# Patient Record
Sex: Female | Born: 1980 | Race: White | Hispanic: No | Marital: Single | Smoking: Current every day smoker
Health system: Southern US, Community
[De-identification: ages and names within clinical notes are randomized; demographics above are authoritative.]

---

## 2005-07-04 ENCOUNTER — Emergency Department (HOSPITAL_COMMUNITY): Admission: EM | Admit: 2005-07-04 | Discharge: 2005-07-05 | Payer: Self-pay | Admitting: Emergency Medicine

## 2012-04-24 ENCOUNTER — Encounter (HOSPITAL_COMMUNITY): Payer: Self-pay | Admitting: Cardiology

## 2012-04-24 ENCOUNTER — Emergency Department (HOSPITAL_COMMUNITY)
Admission: EM | Admit: 2012-04-24 | Discharge: 2012-04-24 | Discharge status: Against Medical Advice | Payer: Self-pay | Attending: Emergency Medicine | Admitting: Emergency Medicine

## 2012-04-24 DIAGNOSIS — Z5321 Procedure and treatment not carried out due to patient leaving prior to being seen by health care provider: Secondary | ICD-10-CM

## 2012-04-24 DIAGNOSIS — R05 Cough: Secondary | ICD-10-CM | POA: Insufficient documentation

## 2012-04-24 DIAGNOSIS — R059 Cough, unspecified: Secondary | ICD-10-CM | POA: Insufficient documentation

## 2012-04-24 DIAGNOSIS — F17200 Nicotine dependence, unspecified, uncomplicated: Secondary | ICD-10-CM | POA: Insufficient documentation

## 2012-04-24 DIAGNOSIS — J029 Acute pharyngitis, unspecified: Secondary | ICD-10-CM | POA: Insufficient documentation

## 2012-04-24 NOTE — ED Notes (Signed)
Pt reports a cough for the past 2 months and sore throat for the past 2 days. Cough is non-productive.

## 2012-04-24 NOTE — ED Notes (Signed)
Pt name called x3 with no answer.

## 2012-04-24 NOTE — ED Notes (Signed)
Called x2 in waiting room; no answer.  Will try again. 

## 2012-04-28 NOTE — ED Provider Notes (Signed)
Pt left without being seen.    Fayrene Helper, PA-C 04/28/12 [phone]

## 2012-04-30 NOTE — ED Provider Notes (Signed)
Medical screening examination/treatment/procedure(s) were performed by non-physician practitioner and as supervising physician I was immediately available for consultation/collaboration.  Bayan Hedstrom T Taylan Marez, MD 04/30/12 2135 

## 2013-02-28 ENCOUNTER — Encounter (HOSPITAL_COMMUNITY): Payer: Self-pay | Admitting: Emergency Medicine

## 2013-02-28 ENCOUNTER — Emergency Department (HOSPITAL_COMMUNITY): Payer: Self-pay

## 2013-02-28 ENCOUNTER — Emergency Department (HOSPITAL_COMMUNITY)
Admission: EM | Admit: 2013-02-28 | Discharge: 2013-02-28 | Disposition: A | Discharge status: Home / Self Care | Payer: Self-pay | Attending: Emergency Medicine | Admitting: Emergency Medicine

## 2013-02-28 DIAGNOSIS — F17200 Nicotine dependence, unspecified, uncomplicated: Secondary | ICD-10-CM | POA: Insufficient documentation

## 2013-02-28 DIAGNOSIS — A499 Bacterial infection, unspecified: Secondary | ICD-10-CM | POA: Insufficient documentation

## 2013-02-28 DIAGNOSIS — R112 Nausea with vomiting, unspecified: Secondary | ICD-10-CM | POA: Insufficient documentation

## 2013-02-28 DIAGNOSIS — A599 Trichomoniasis, unspecified: Secondary | ICD-10-CM

## 2013-02-28 DIAGNOSIS — B9689 Other specified bacterial agents as the cause of diseases classified elsewhere: Secondary | ICD-10-CM

## 2013-02-28 DIAGNOSIS — R109 Unspecified abdominal pain: Secondary | ICD-10-CM

## 2013-02-28 DIAGNOSIS — N760 Acute vaginitis: Secondary | ICD-10-CM | POA: Insufficient documentation

## 2013-02-28 DIAGNOSIS — Z3202 Encounter for pregnancy test, result negative: Secondary | ICD-10-CM | POA: Insufficient documentation

## 2013-02-28 DIAGNOSIS — R197 Diarrhea, unspecified: Secondary | ICD-10-CM | POA: Insufficient documentation

## 2013-02-28 LAB — CBC WITH DIFFERENTIAL/PLATELET
Basophils Absolute: 0.1 10*3/uL (ref 0.0–0.1)
Eosinophils Relative: 1 % (ref 0–5)
Lymphocytes Relative: 31 % (ref 12–46)
MCV: 92.3 fL (ref 78.0–100.0)
Neutrophils Relative %: 61 % (ref 43–77)
Platelets: 321 10*3/uL (ref 150–400)
RDW: 13 % (ref 11.5–15.5)
WBC: 10.7 10*3/uL — ABNORMAL HIGH (ref 4.0–10.5)

## 2013-02-28 LAB — URINE MICROSCOPIC-ADD ON

## 2013-02-28 LAB — POCT I-STAT, CHEM 8
BUN: 14 mg/dL (ref 6–23)
Chloride: 108 mEq/L (ref 96–112)
Creatinine, Ser: 1.4 mg/dL — ABNORMAL HIGH (ref 0.50–1.10)
Glucose, Bld: 98 mg/dL (ref 70–99)
Potassium: 3.8 mEq/L (ref 3.5–5.1)

## 2013-02-28 LAB — COMPREHENSIVE METABOLIC PANEL
ALT: 19 U/L (ref 0–35)
AST: 23 U/L (ref 0–37)
CO2: 21 mEq/L (ref 19–32)
Calcium: 9.4 mg/dL (ref 8.4–10.5)
GFR calc non Af Amer: 63 mL/min — ABNORMAL LOW (ref >90)
Potassium: 4 mEq/L (ref 3.5–5.1)
Sodium: 140 mEq/L (ref 135–145)
Total Protein: 7.3 g/dL (ref 6.0–8.3)

## 2013-02-28 LAB — URINALYSIS, ROUTINE W REFLEX MICROSCOPIC
Bilirubin Urine: NEGATIVE
Hgb urine dipstick: NEGATIVE
Specific Gravity, Urine: 1.022 (ref 1.005–1.030)
pH: 6 (ref 5.0–8.0)

## 2013-02-28 LAB — WET PREP, GENITAL

## 2013-02-28 MED ORDER — HYDROCODONE-ACETAMINOPHEN 5-325 MG PO TABS
2.0000 | ORAL_TABLET | Freq: Once | ORAL | Status: AC
  Administered 2013-02-28: 2 via ORAL
  Filled 2013-02-28: qty 2

## 2013-02-28 MED ORDER — IOHEXOL 300 MG/ML  SOLN
25.0000 mL | INTRAMUSCULAR | Status: AC
  Administered 2013-02-28: 25 mL via ORAL

## 2013-02-28 MED ORDER — METRONIDAZOLE 500 MG PO TABS: 500.0000 mg | ORAL_TABLET | Freq: Two times a day (BID) | ORAL | Status: DC

## 2013-02-28 MED ORDER — ONDANSETRON 4 MG PO TBDP
8.0000 mg | ORAL_TABLET | Freq: Once | ORAL | Status: AC
  Administered 2013-02-28: 8 mg via ORAL
  Filled 2013-02-28: qty 2

## 2013-02-28 MED ORDER — CIPROFLOXACIN HCL 500 MG PO TABS: 500.0000 mg | ORAL_TABLET | Freq: Two times a day (BID) | ORAL | Status: DC

## 2013-02-28 MED ORDER — PROMETHAZINE HCL 25 MG PO TABS: 25.0000 mg | ORAL_TABLET | Freq: Four times a day (QID) | ORAL | Status: DC | PRN

## 2013-02-28 MED ORDER — ONDANSETRON HCL 4 MG/2ML IJ SOLN
4.0000 mg | INTRAMUSCULAR | Status: AC
  Administered 2013-02-28: 4 mg via INTRAVENOUS
  Filled 2013-02-28: qty 2

## 2013-02-28 MED ORDER — HYDROMORPHONE HCL PF 1 MG/ML IJ SOLN
1.0000 mg | Freq: Once | INTRAMUSCULAR | Status: AC
  Administered 2013-02-28: 1 mg via INTRAVENOUS
  Filled 2013-02-28: qty 1

## 2013-02-28 MED ORDER — SODIUM CHLORIDE 0.9 % IV BOLUS (SEPSIS)
1000.0000 mL | Freq: Once | INTRAVENOUS | Status: AC
  Administered 2013-02-28: 1000 mL via INTRAVENOUS

## 2013-02-28 MED ORDER — HYDROCODONE-ACETAMINOPHEN 5-325 MG PO TABS: 1.0000 | ORAL_TABLET | Freq: Four times a day (QID) | ORAL | Status: DC | PRN

## 2013-02-28 MED ORDER — KETOROLAC TROMETHAMINE 30 MG/ML IJ SOLN
30.0000 mg | Freq: Once | INTRAMUSCULAR | Status: AC
  Administered 2013-02-28: 30 mg via INTRAVENOUS
  Filled 2013-02-28: qty 1

## 2013-02-28 NOTE — ED Notes (Signed)
Patient tolerating contrast.

## 2013-02-28 NOTE — Progress Notes (Signed)
Met Patient at bedside.Role of Case Manager explained.Patient verbalizes her understanding.Patient reports Increased abdominal pain as reason for ED visit.Patient reports she resides in  [place] with her sister.She does not have health Insurance or a PCP. Education provided on Importance of establishing a PCP.Resource sheet for the [employer] Provided.This Clinical research associate explained with patient consent she can e-mail the [facility] and help set up patients appointment.Patient receptive to assistance and provided her consent. Patient provided with a resource card for the [employer] and a [place] -Prescription drug plan.No other Case Manger needs identified.

## 2013-02-28 NOTE — Progress Notes (Signed)
Patient provided with a [place] prescription discount card.

## 2013-02-28 NOTE — ED Provider Notes (Signed)
Medical screening examination/treatment/procedure(s) were performed by non-physician practitioner and as supervising physician I was immediately available for consultation/collaboration.     Geoffery Lyons, MD 02/28/13 [phone]

## 2013-02-28 NOTE — ED Notes (Signed)
Pt. reports right lateral abdominal pain with nausea, vomitting and diarrhea for 2 days, denies fever or chills. No urinary discomfort.

## 2013-02-28 NOTE — ED Provider Notes (Signed)
CSN: [id number]     Arrival date & time 02/28/13  0031 History   First MD Initiated Contact with Patient 02/28/13 0354     Chief Complaint  Patient presents with  . Abdominal Pain   (Consider location/radiation/quality/duration/timing/severity/associated sxs/prior Treatment) HPI Comments: Patient is a 32 year old female with no significant past medical history presents for abdominal pain x2 days. Patient states abdominal pain has been worsening since onset without any modifying factors. Patient describes the pain as sharp, wave-like, and radiating from her RLQ around to her R flank. She endorses associated nausea, NB/NB emesis, and nonbloody diarrhea the last of which was 1 hour ago. Patient denies associated fever, CP, SOB, melena, hematochezia, dysuria, hematuria, vaginal pain or discharge, and numbness/tingling. Abdominal surgical hx significant only for C-section. No hx of kidney stones.  Patient is a 32 y.o. female presenting with abdominal pain. The history is provided by the patient. No language interpreter was used.  Abdominal Pain   Past Medical History  Diagnosis Date  . Cesarean delivery delivered    History reviewed. No pertinent past surgical history. No family history on file. History  Substance Use Topics  . Smoking status: Current Every Day Smoker  . Smokeless tobacco: Not on file  . Alcohol Use: No   OB History   Grav Para Term Preterm Abortions TAB SAB Ect Mult Living                 Review of Systems  Gastrointestinal: Positive for abdominal pain.    Allergies  Review of patient's allergies indicates no known allergies.  Home Medications  No current outpatient prescriptions on file. BP 118/63  Pulse 71  Temp(Src) 97.6 F (36.4 C) (Oral)  Resp 21  Wt 183 lb 4.8 oz (83.144 kg)  SpO2 98%  LMP 02/13/2013  Physical Exam  Nursing note and vitals reviewed. Constitutional: She is oriented to person, place, and time. She appears well-developed and  well-nourished. No distress.  HENT:  Head: Normocephalic and atraumatic.  Eyes: Conjunctivae and EOM are normal. No scleral icterus.  Neck: Normal range of motion.  Cardiovascular: Normal rate, regular rhythm and normal heart sounds.   Pulmonary/Chest: Effort normal. No respiratory distress. She has no wheezes. She has no rales.  Abdominal: Soft. She exhibits no distension and no mass. There is tenderness (RLQ and R flank). There is no rebound and no guarding.  No peritoneal signs or evidence of acute surgical abdomen  Genitourinary: Uterus normal. There is no rash, tenderness, lesion or injury on the right labia. There is no rash, tenderness, lesion or injury on the left labia. Cervix exhibits no motion tenderness, no discharge and no friability. Right adnexum displays no mass, no tenderness and no fullness. Left adnexum displays no mass, no tenderness and no fullness. No erythema, tenderness or bleeding around the vagina. No signs of injury around the vagina. Vaginal discharge (White, frothy) found.  Musculoskeletal: Normal range of motion.  Neurological: She is alert and oriented to person, place, and time.  Skin: Skin is warm and dry. No rash noted. She is not diaphoretic. No erythema. No pallor.  Psychiatric: She has a normal mood and affect. Her behavior is normal.    ED Course  Procedures (including critical care time) Labs Review Labs Reviewed  WET PREP, GENITAL - Abnormal; Notable for the following:    Trich, Wet Prep FEW (*)    Clue Cells Wet Prep HPF POC MANY (*)    WBC, Wet Prep HPF POC FEW (*)  All other components within normal limits  CBC WITH DIFFERENTIAL - Abnormal; Notable for the following:    WBC 10.7 (*)    Hemoglobin 15.5 (*)    All other components within normal limits  COMPREHENSIVE METABOLIC PANEL - Abnormal; Notable for the following:    Creatinine, Ser 1.16 (*)    Total Bilirubin 0.2 (*)    GFR calc non Af Amer 63 (*)    GFR calc Af Amer 72 (*)    All  other components within normal limits  URINALYSIS, ROUTINE W REFLEX MICROSCOPIC - Abnormal; Notable for the following:    APPearance CLOUDY (*)    Leukocytes, UA MODERATE (*)    All other components within normal limits  URINE MICROSCOPIC-ADD ON - Abnormal; Notable for the following:    Squamous Epithelial / LPF MANY (*)    Bacteria, UA MANY (*)    All other components within normal limits  POCT I-STAT, CHEM 8 - Abnormal; Notable for the following:    Creatinine, Ser 1.40 (*)    Calcium, Ion 1.25 (*)    All other components within normal limits  URINE CULTURE  GC/CHLAMYDIA PROBE AMP  POCT PREGNANCY, URINE   Imaging Review No results found.  EKG Interpretation   None       MDM  No diagnosis found.  Patient presents for abdominal pain in RLQ radiating to R flank with associated nausea, vomiting, and diarrhea. Patient uncomfortable and tearful on arrival, but hemodynamically stable, nontoxic appearing, and afebrile. Patient with TTP in RLQ and R flank on exam without peritoneal signs. No TTP of uterus or adnexa. Labs with mild leukocytosis. UA suggestive of trichomonas UTI and many clue cells on wet prep. Given symptoms, question UTI vs pyelonephritis vs atypical appendicitis vs viral syndrome. Patient's symptoms currently being managed with IVF, Dilaudid, Toradol and Zofran. CT abdomen pelvis pending for further work up of abdominal pain.  Patient signed out to Junious Silk, PA-C at shift change for dispo with CT imaging pending. Patient will need Rx for Flagyl BID for trichomonas vaginitis and bacterial vaginosis.   Antony Madura, PA-C 02/28/13 [phone]

## 2013-02-28 NOTE — ED Provider Notes (Signed)
Physical Exam  BP 115/76  Pulse 63  Temp(Src) 97.6 F (36.4 C) (Oral)  Resp 18  Wt 183 lb 4.8 oz (83.144 kg)  SpO2 96%  LMP 02/03/2013  Physical Exam  Nursing note and vitals reviewed. Constitutional: She is oriented to person, place, and time. She appears well-developed and well-nourished. No distress.  HENT:  Head: Normocephalic and atraumatic.  Right Ear: External ear normal.  Left Ear: External ear normal.  Nose: Nose normal.  Mouth/Throat: Oropharynx is clear and moist.  Eyes: Conjunctivae are normal.  Neck: Normal range of motion.  Cardiovascular: Normal rate, regular rhythm and normal heart sounds.   Pulmonary/Chest: Effort normal and breath sounds normal. No stridor. No respiratory distress. She has no wheezes. She has no rales.  Abdominal: Soft. She exhibits no distension. There is no tenderness. There is no rigidity, no rebound, no guarding and no CVA tenderness.    Musculoskeletal: Normal range of motion.       Back:  Neurological: She is alert and oriented to person, place, and time. She has normal strength.  Skin: Skin is warm and dry. She is not diaphoretic. No erythema.  Psychiatric: She has a normal mood and affect. Her behavior is normal.   Results for orders placed during the hospital encounter of 02/28/13  WET PREP, GENITAL      Result Value Range   Yeast Wet Prep HPF POC NONE SEEN  NONE SEEN   Trich, Wet Prep FEW (*) NONE SEEN   Clue Cells Wet Prep HPF POC MANY (*) NONE SEEN   WBC, Wet Prep HPF POC FEW (*) NONE SEEN  CBC WITH DIFFERENTIAL      Result Value Range   WBC 10.7 (*) 4.0 - 10.5 K/uL   RBC 4.80  3.87 - 5.11 MIL/uL   Hemoglobin 15.5 (*) 12.0 - 15.0 g/dL   HCT 16.1  09.6 - 04.5 %   MCV 92.3  78.0 - 100.0 fL   MCH 32.3  26.0 - 34.0 pg   MCHC 35.0  30.0 - 36.0 g/dL   RDW 40.9  81.1 - 91.4 %   Platelets 321  150 - 400 K/uL   Neutrophils Relative % 61  43 - 77 %   Neutro Abs 6.5  1.7 - 7.7 K/uL   Lymphocytes Relative 31  12 - 46 %   Lymphs Abs 3.3  0.7 - 4.0 K/uL   Monocytes Relative 7  3 - 12 %   Monocytes Absolute 0.7  0.1 - 1.0 K/uL   Eosinophils Relative 1  0 - 5 %   Eosinophils Absolute 0.1  0.0 - 0.7 K/uL   Basophils Relative 1  0 - 1 %   Basophils Absolute 0.1  0.0 - 0.1 K/uL  COMPREHENSIVE METABOLIC PANEL      Result Value Range   Sodium 140  135 - 145 mEq/L   Potassium 4.0  3.5 - 5.1 mEq/L   Chloride 106  96 - 112 mEq/L   CO2 21  19 - 32 mEq/L   Glucose, Bld 98  70 - 99 mg/dL   BUN 13  6 - 23 mg/dL   Creatinine, Ser 7.82 (*) 0.50 - 1.10 mg/dL   Calcium 9.4  8.4 - 95.6 mg/dL   Total Protein 7.3  6.0 - 8.3 g/dL   Albumin 3.6  3.5 - 5.2 g/dL   AST 23  0 - 37 U/L   ALT 19  0 - 35 U/L   Alkaline Phosphatase 78  39 - 117 U/L   Total Bilirubin 0.2 (*) 0.3 - 1.2 mg/dL   GFR calc non Af Amer 63 (*) >90 mL/min   GFR calc Af Amer 72 (*) >90 mL/min  URINALYSIS, ROUTINE W REFLEX MICROSCOPIC      Result Value Range   Color, Urine YELLOW  YELLOW   APPearance CLOUDY (*) CLEAR   Specific Gravity, Urine 1.022  1.005 - 1.030   pH 6.0  5.0 - 8.0   Glucose, UA NEGATIVE  NEGATIVE mg/dL   Hgb urine dipstick NEGATIVE  NEGATIVE   Bilirubin Urine NEGATIVE  NEGATIVE   Ketones, ur NEGATIVE  NEGATIVE mg/dL   Protein, ur NEGATIVE  NEGATIVE mg/dL   Urobilinogen, UA 1.0  0.0 - 1.0 mg/dL   Nitrite NEGATIVE  NEGATIVE   Leukocytes, UA MODERATE (*) NEGATIVE  URINE MICROSCOPIC-ADD ON      Result Value Range   Squamous Epithelial / LPF MANY (*) RARE   WBC, UA 11-20  <3 WBC/hpf   RBC / HPF 0-2  <3 RBC/hpf   Bacteria, UA MANY (*) RARE   Urine-Other TRICHOMONAS PRESENT    POCT I-STAT, CHEM 8      Result Value Range   Sodium 144  135 - 145 mEq/L   Potassium 3.8  3.5 - 5.1 mEq/L   Chloride 108  96 - 112 mEq/L   BUN 14  6 - 23 mg/dL   Creatinine, Ser 9.60 (*) 0.50 - 1.10 mg/dL   Glucose, Bld 98  70 - 99 mg/dL   Calcium, Ion 4.54 (*) 1.12 - 1.23 mmol/L   TCO2 21  0 - 100 mmol/L   Hemoglobin 14.6  12.0 - 15.0 g/dL   HCT 09.8   11.9 - 14.7 %  POCT PREGNANCY, URINE      Result Value Range   Preg Test, Ur NEGATIVE  NEGATIVE   Ct Abdomen Pelvis Wo Contrast  02/28/2013   CLINICAL DATA:  Right lower quadrant and right flank pain.  EXAM: CT ABDOMEN AND PELVIS WITHOUT CONTRAST  TECHNIQUE: Multidetector CT imaging of the abdomen and pelvis was performed following the standard protocol without intravenous contrast.  COMPARISON:  None.  FINDINGS: There is a 2 mm stone in the upper pole of the left kidney. No right renal stones. No hydronephrosis. No ureteral or bladder calculi.  There small calcifications in the posterior aspect of the lower right lobe of the liver consistent with granulomas. Liver parenchyma is otherwise normal. Biliary tree, spleen, pancreas, and adrenal glands are normal. The bowel is normal including the terminal ileum and appendix. No adenopathy. Uterus and ovaries are normal. No free air or free fluid. No osseous abnormality.  The patient does have slight linear atelectasis at both lung bases.  IMPRESSION: No acute abnormalities of the abdomen or pelvis. Tiny stone in the upper pole of the left kidney.   Electronically Signed   By: Geanie Cooley M.D.   On: 02/28/2013 08:48     ED Course  Procedures  MDM  Patient signed out to me by [facility], PA-C. CT abdomen and pelvis is pending. The plan is to discharge home with Cipro and Flagyl to treat Trichomonas, BV, possible pyelonephritis there is no other abnormality on CT scan.  No abnormality on CT scan. Discussed the patient there is a tiny stone in the upper pole of her left kidney. I discussed that I do not believe this is causing any problems or pain. She does not have CVA tenderness, she is  afebrile. She will be discharged home on Cipro and Flagyl. Return instructions given. Vital signs stable for discharge. Patient / Family / Caregiver informed of clinical course, understand medical decision-making process, and agree with plan.       Mora Bellman,  PA-C 02/28/13 [phone]

## 2013-03-01 ENCOUNTER — Telehealth: Payer: Self-pay | Admitting: General Practice

## 2013-03-01 LAB — URINE CULTURE

## 2013-03-01 LAB — GC/CHLAMYDIA PROBE AMP: GC Probe RNA: NEGATIVE

## 2013-03-01 NOTE — Telephone Encounter (Signed)
Called pt to schedule appt to establish care with a PCP.

## 2013-03-07 NOTE — ED Provider Notes (Signed)
Medical screening examination/treatment/procedure(s) were performed by non-physician practitioner and as supervising physician I was immediately available for consultation/collaboration.  EKG Interpretation   None        Derwood Kaplan, MD 03/07/13 [phone]

## 2013-09-06 ENCOUNTER — Emergency Department (HOSPITAL_COMMUNITY)
Admission: EM | Admit: 2013-09-06 | Discharge: 2013-09-06 | Disposition: A | Discharge status: Home / Self Care | Payer: Self-pay | Attending: Emergency Medicine | Admitting: Emergency Medicine

## 2013-09-06 ENCOUNTER — Encounter (HOSPITAL_COMMUNITY): Payer: Self-pay | Admitting: Emergency Medicine

## 2013-09-06 DIAGNOSIS — F17200 Nicotine dependence, unspecified, uncomplicated: Secondary | ICD-10-CM | POA: Insufficient documentation

## 2013-09-06 DIAGNOSIS — S99929A Unspecified injury of unspecified foot, initial encounter: Secondary | ICD-10-CM

## 2013-09-06 DIAGNOSIS — IMO0002 Reserved for concepts with insufficient information to code with codable children: Secondary | ICD-10-CM | POA: Insufficient documentation

## 2013-09-06 DIAGNOSIS — M549 Dorsalgia, unspecified: Secondary | ICD-10-CM

## 2013-09-06 DIAGNOSIS — S79929A Unspecified injury of unspecified thigh, initial encounter: Secondary | ICD-10-CM

## 2013-09-06 DIAGNOSIS — S8990XA Unspecified injury of unspecified lower leg, initial encounter: Secondary | ICD-10-CM | POA: Insufficient documentation

## 2013-09-06 DIAGNOSIS — S79919A Unspecified injury of unspecified hip, initial encounter: Secondary | ICD-10-CM | POA: Insufficient documentation

## 2013-09-06 DIAGNOSIS — S99919A Unspecified injury of unspecified ankle, initial encounter: Secondary | ICD-10-CM

## 2013-09-06 DIAGNOSIS — Y92410 Unspecified street and highway as the place of occurrence of the external cause: Secondary | ICD-10-CM | POA: Insufficient documentation

## 2013-09-06 DIAGNOSIS — Y9389 Activity, other specified: Secondary | ICD-10-CM | POA: Insufficient documentation

## 2013-09-06 MED ORDER — DIAZEPAM 5 MG PO TABS: 5.0000 mg | ORAL_TABLET | Freq: Two times a day (BID) | ORAL | Status: DC

## 2013-09-06 MED ORDER — DIAZEPAM 5 MG/ML IJ SOLN
5.0000 mg | Freq: Once | INTRAMUSCULAR | Status: AC
  Administered 2013-09-06: 5 mg via INTRAMUSCULAR
  Filled 2013-09-06: qty 2

## 2013-09-06 MED ORDER — OXYCODONE-ACETAMINOPHEN 5-325 MG PO TABS
1.0000 | ORAL_TABLET | Freq: Once | ORAL | Status: AC
  Administered 2013-09-06: 1 via ORAL
  Filled 2013-09-06: qty 1

## 2013-09-06 MED ORDER — HYDROCODONE-ACETAMINOPHEN 5-325 MG PO TABS: 1.0000 | ORAL_TABLET | ORAL | Status: DC | PRN
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
## 2013-09-06 NOTE — Discharge Instructions (Signed)
Take the prescribed medication as directed.  May wish to apply heat to low back to help with pain as well. Follow-up with your primary care physician.  If you do not have one resource guide is attached to help with this. Return to the ED for new or worsening symptoms.   Emergency Department Resource Guide 1) Find a Doctor and Pay Out of Pocket Although you won't have to find out who is covered by your insurance plan, it is a good idea to ask around and get recommendations. You will then need to call the office and see if the doctor you have chosen will accept you as a new patient and what types of options they offer for patients who are self-pay. Some doctors offer discounts or will set up payment plans for their patients who do not have insurance, but you will need to ask so you aren't surprised when you get to your appointment.  2) Contact Your Local Health Department Not all health departments have doctors that can see patients for sick visits, but many do, so it is worth a call to see if yours does. If you don't know where your local health department is, you can check in your phone book. The CDC also has a tool to help you locate your state's health department, and many state websites also have listings of all of their local health departments.  3) Find a Walk-in Clinic If your illness is not likely to be very severe or complicated, you may want to try a walk in clinic. These are popping up all over the country in pharmacies, drugstores, and shopping centers. They're usually staffed by nurse practitioners or physician assistants that have been trained to treat common illnesses and complaints. They're usually fairly quick and inexpensive. However, if you have serious medical issues or chronic medical problems, these are probably not your best option.  No Primary Care Doctor: - Call Health Connect at  [phone] - they can help you locate a primary care doctor that  accepts your insurance, provides  certain services, etc. - Physician Referral Service- [phone]  Chronic Pain Problems: Organization         Address  Phone   Notes  [facility]  [phone]) [phone] Patients need to be referred by their primary care doctor.   Medication Assistance: Organization         Address  Phone   Notes  [facility] [address] [place], [place] [address] [phone] --Must be a resident of [facility] -- Must have NO insurance coverage whatsoever (no Medicaid/ Medicare, etc.) -- The pt. MUST have a primary care doctor that directs their care regularly and follows them in the community   MedAssist  [phone]) [phone]   [employer]  [phone]    Agencies that provide inexpensive medical care: Organization         Address  Phone   Notes  [facility]  [phone]   [facility]    [phone]   [facility] [address] [place], [place] [address] [phone]   [facility] [address], [place] [phone]) [phone]   Planned Parenthood    [phone]   [facility]    [phone]) [phone]   [facility]  [address], [place] Phone:  [phone](336) [phone], Fax:  [phone] Hours of Operation:  9 am - 6 pm, M-F.  Also accepts Medicaid/Medicare and self-pay.  [facility]  [address], [place] Phone: [phone], Fax: [phone]. Hours of Operation:  8:30 am - 5:30 pm, M-F.  Also accepts Medicaid and self-pay.  [facility] [place] [address] [place] Phone: [phone]   [facility] [address] Natasha Bence [place], [place] [phone], Ext. [phone] Mondays & Thursdays: 7-9 AM.  First 15 patients are seen on a first come, first serve basis.    Medicaid-accepting [facility] Providers:  Organization         Address  Phone   Notes  [facility] [address], [place] [phone] Also accepts self-pay patients.  [facility] [address] Laurell Josephs [place], [place]  [phone]   [facility] [address], [place] [phone]   [facility] [address], [place] [phone]   Renaye Rakers [address], [place]   [phone] Only accepts [place] patients after they have their name applied to their card.   Self-Pay (no insurance) in [facility]:  Organization         Address  Phone   Notes  Sickle Cell Patients, [facility] [address] [place], [place] [phone]   [facility] [address] [place], [place] [phone]   [facility] [place]  [address], [place] [phone]   [facility]/Dr. Osei-Bonsu  [address] [place] or [address], [place] [phone] Phone number for both [place] and [place] locations is the same.  [facility]gent Medical and [facility] [address], [place] [phone]   [facility] [address], [place] or [address] [phone]   [facility] [address] [place] [phone], phone; [phone], fax Sees patients 1st and 3rd Saturday of every month.  Must not qualify for public or private insurance (i.e. Medicaid, Medicare, [place] Health Choice, Veterans' Benefits)  Household income should be no more than 200% of the poverty level The clinic cannot treat you if you are pregnant or think you are pregnant  Sexually transmitted diseases are not treated at the clinic.    Dental Care: Organization         Address  Phone  Notes  [facility] Department of [facility] [address] [place], [place] [phone]  Accepts children up to age 11 who are enrolled in [place] or [place] Health Choice; pregnant women with a Medicaid card; and children who have applied for Medicaid or [place] Health Choice, but were declined, whose parents can pay a reduced fee at time of service.  [facility]  [address], [place] [phone] Accepts children up to age 68 who are enrolled in [place] or [place] Health Choice; pregnant women with a Medicaid card; and children who have applied for Medicaid or [place] Health Choice, but were declined, whose parents can pay a reduced fee at time of service.  [facility]  [address] [place], [place] [phone] Patients are seen by appointment only. Walk-ins are not accepted. [facility] will see patients 18 years  of age and older. Monday - Tuesday (8am-5pm) Most Wednesdays (8:30-5pm) $30 per visit, cash only  [facility]  [address], [place] [phone]) [phone] Patients are seen by appointment only. Walk-ins are not accepted. [facility] will see patients 31 years of age and older. One Wednesday Evening (Monthly: Volunteer Based).  $30 per visit, cash only  [facility]  [phone]) [phone] for adults; Children under age 194, call Graduate Pediatric Dentistry at [phone]) [phone]. Children aged 154-14, please call [phone]) [phone] to request a pediatric application.  Dental services are provided in all areas of dental care including fillings, crowns and bridges, complete and partial dentures, implants, gum treatment, root canals, and extractions. Preventive care is also provided. Treatment is provided to both adults and children. Patients are selected via a lottery and there is often a waiting list.   [facility] [address], [place]  [phone] [email or web address]   [facility] [address], [place], [place] [phone], Ext. [phone] Second  and Fourth Thursday of each month, opens at 6:30 AM; Clinic ends at 9 AM.  Patients are seen on a first-come first-served basis, and a limited number are seen during each clinic.   [facility]  [address], [place], [place] [phone]) [phone]   Eligibility Requirements You must have lived in [place], [place], or [place] counties for at least the last three months.   You cannot be eligible for state or federal sponsored National Cityhealthcare insurance, including CIGNAVeterans Administration, [place]icaid, or [employer]Medicare.   You generally cannot be eligible for healthcare insurance through your employer.    How to apply: Eligibility screenings are held every Tuesday and Wednesday afternoon from 1:00 pm until 4:00 pm. You do not need an appointment for the interview!  [facility] [address], [place], [place] [phone]   [facility]  [phone]   [facility]  [phone]   [facility]  [phone]    Behavioral Health Resources in the Community: Intensive Outpatient Programs Organization         Address  Phone  Notes  [facility] [address], [place], [place] [phone]   [facility] [address], [place], [place] [phone]   [facility] [address], [place], [place]  [phone]   [facility] [address],  [place], [place] [phone] or [phone]   Substance Abuse Resources Organization         Address  Phone  Notes  [facility]  [phone]   [facility]  [phone]   [facility]  [phone]   [facility]  [phone]   Residential & Outpatient Substance Abuse Program  [phone]   Psychological Services Organization         Address  Phone  Notes  [facility]  [phone]- [phone]   [facility]  [phone]- [phone]   [facility] [address], [place] [phone] or [phone]    Mobile Crisis Teams Organization         Address  Phone  Notes  [facility], Mobile Crisis Care Unit  [phone]   [facility]  [address] [place], [place] [phone]   [facility] Psharon DeEsch [address] [place] [phone]    Self-Help/Support Groups Organization  Address  Phone             Notes  [facility] [place] - variety of support groups  [phone] Call for more information  Narcotics Anonymous (NA), [facility]aring [facility] [address], [place]  2 meetings at this location   Statisticianesidential Treatment Programs Organization         Address  Phone  Notes  [facility] [address],    [place]  [phone]   [facility]  [address], [place] [phone], [place], [place] [phone]   [facility] [address], [place] [phone] Admissions: 8am-3pm M-F  [facility] [address]    [place], [place] [phone]   [facility] [address], [place], [place] [phone]   [facility] [address]  [place], [place] [phone]   [facility] - Intensive Outpatient [address], [place], [place] [phone]   [facility] ([facility] [address]  [place], [place] [phone] or [phone]   [facility] ([facility]) [address] [place], [place] [phone] Accepts Medicaid  [facility] [address]  [place] [phone] Substance Abuse/Addiction Treatment   [facility] Resources Organization         Address  Phone  Notes  [facility]  [phone]) [phone]   Angie FavaJulie Brannon, PhD [address], [address] [place], [place]   [phone] or [phone]   [facility]   [address] [place], [place]  [phone]   [facility] [address], [place], [place] [phone] Insurance/Medicaid/sponsorship through [facility]  [facility] Families [address]                                    [place], [place] [phone] Therapy/tele-psych/case  [facility] [address]   [place], [place] [phone]    Dr. Lolly MustacheArfeen  [phone](336) [phone]   [facility] [place]  [employer] [facility] 1) [address], [place] 2) [address], [place] 3)  [address], [place] [phone]  [phone]) [phone]   [facility] Child Abuse Hotline [phone]) [phone] or [phone]) [phone] (After Hours)
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
## 2013-09-07 NOTE — ED Provider Notes (Signed)
Medical screening examination/treatment/procedure(s) were performed by non-physician practitioner and as supervising physician I was immediately available for consultation/collaboration.   EKG Interpretation None        Evaristo Tsuda, MD 09/07/13 0038 

## 2014-03-06 IMAGING — CT CT ABD-PELV W/O CM
2 of 4 series · 17 of 46 positions shown, 19 images · non-contrast
Comparison: None.

CLINICAL DATA: Right lower quadrant and right flank pain.

EXAM:
CT ABDOMEN AND PELVIS WITHOUT CONTRAST
TECHNIQUE: Multidetector CT imaging of the abdomen and pelvis was performed
following the standard protocol without intravenous contrast.

[Series 2: abd/ pelvis 5.0 i30f 1 · axial · 0.78mm/px · z∈[+697,+1127]mm · 14 of 96 slices shown, 16 images]
[im 5/96  soft-tissue]
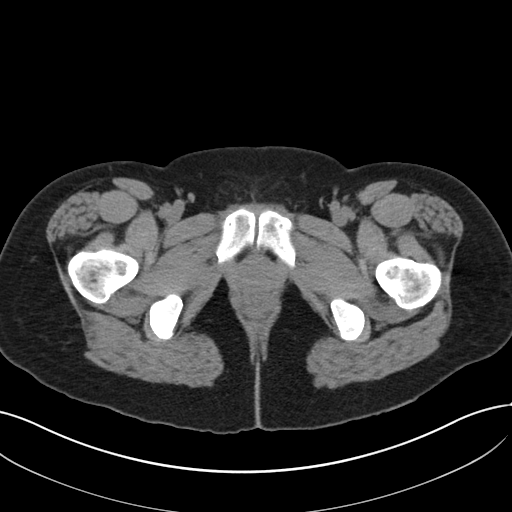
[im 5/96  bone]
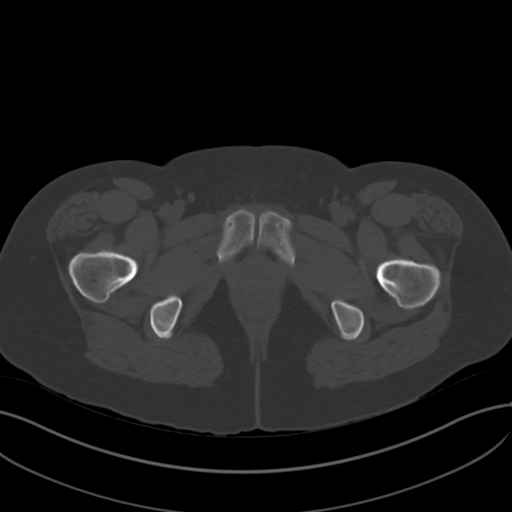
[im 14/96  soft-tissue]
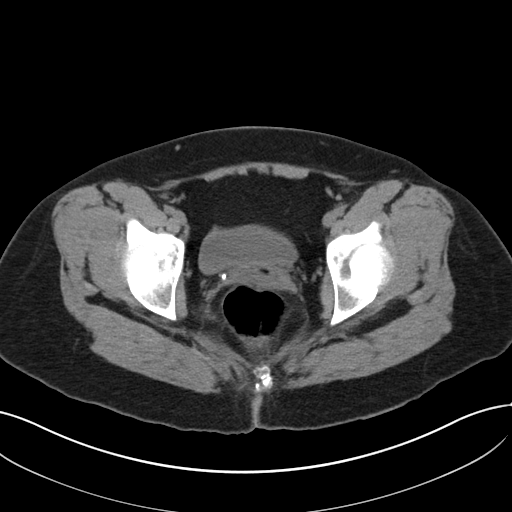
[im 19/96  soft-tissue]
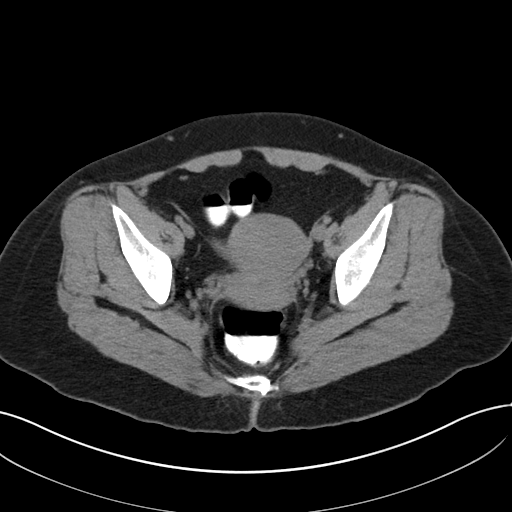
[im 28/96  soft-tissue]
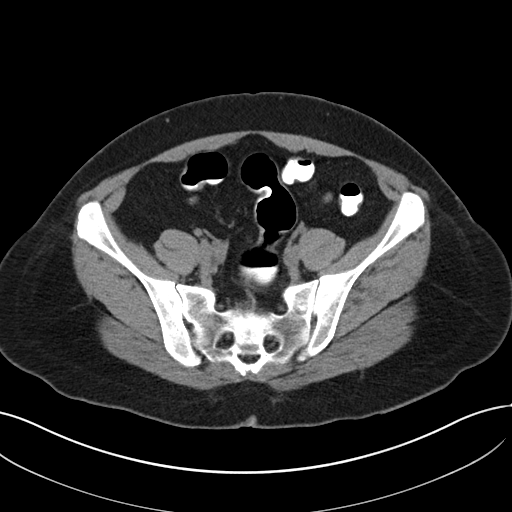
[im 32/96  soft-tissue]
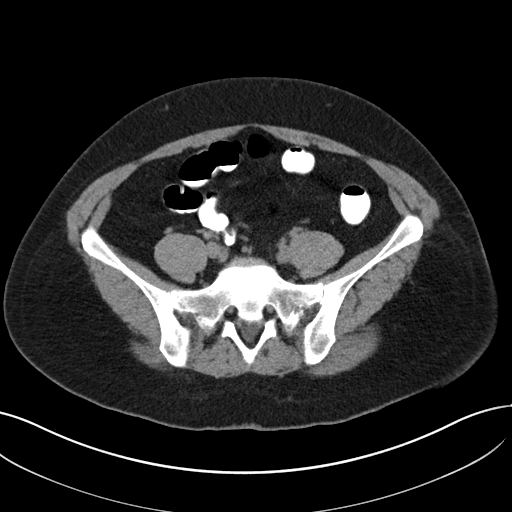
[im 37/96  soft-tissue]
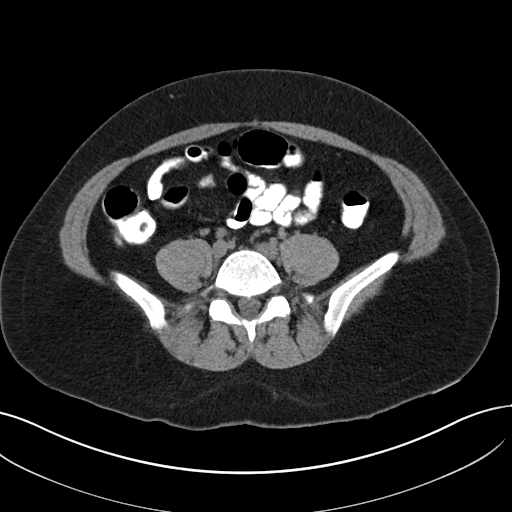
[im 46/96  soft-tissue]
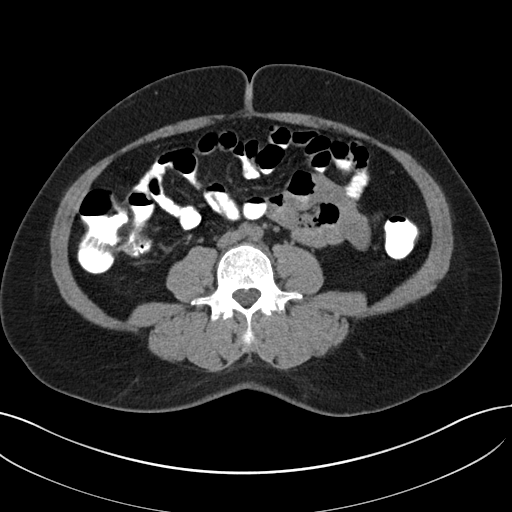
[im 50/96  soft-tissue]
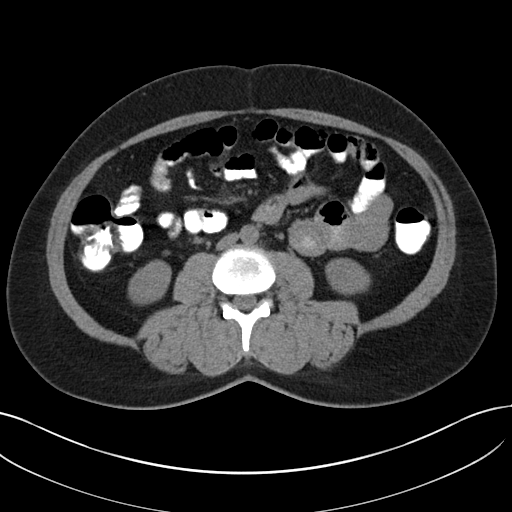
[im 59/96  soft-tissue]
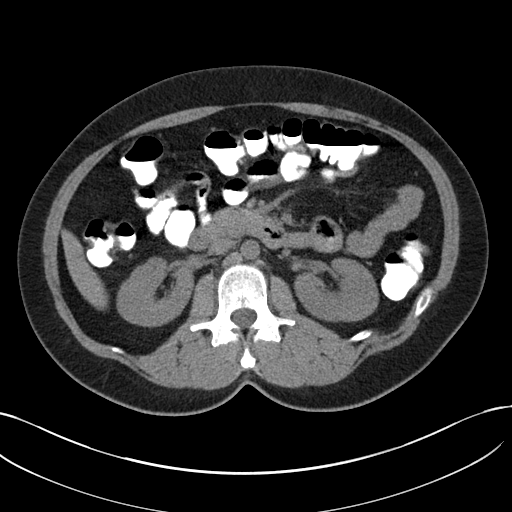
[im 59/96  bone]
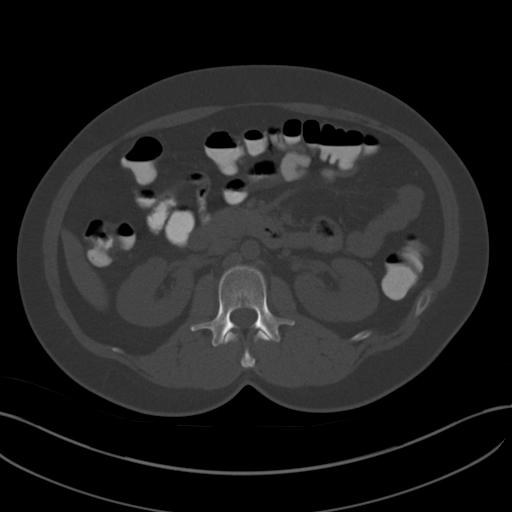
[im 64/96  soft-tissue]
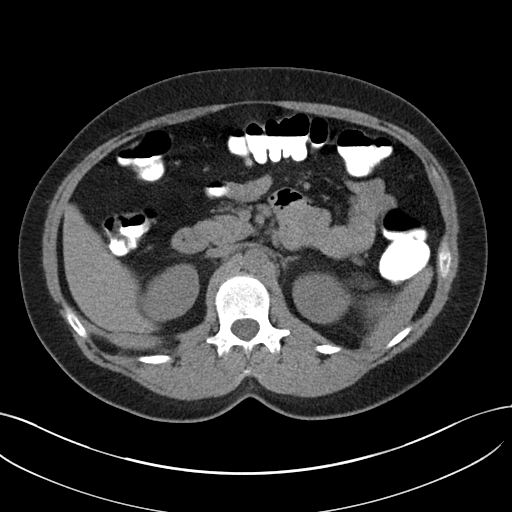
[im 73/96  soft-tissue]
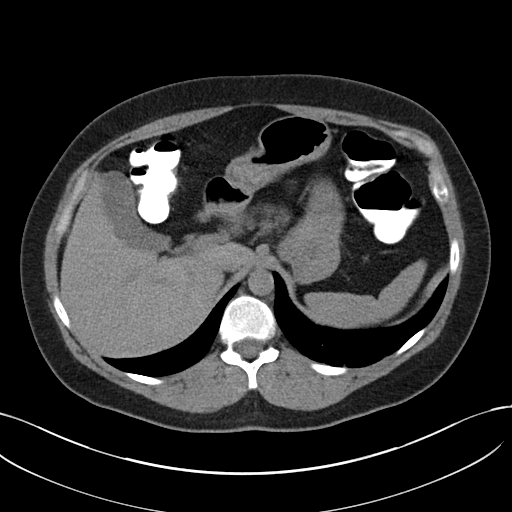
[im 77/96  soft-tissue]
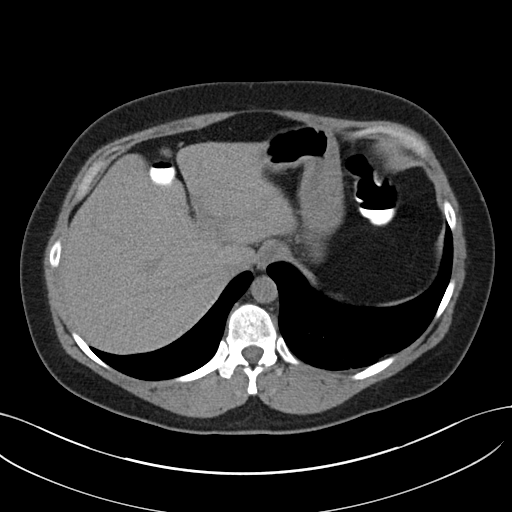
[im 82/96  soft-tissue]
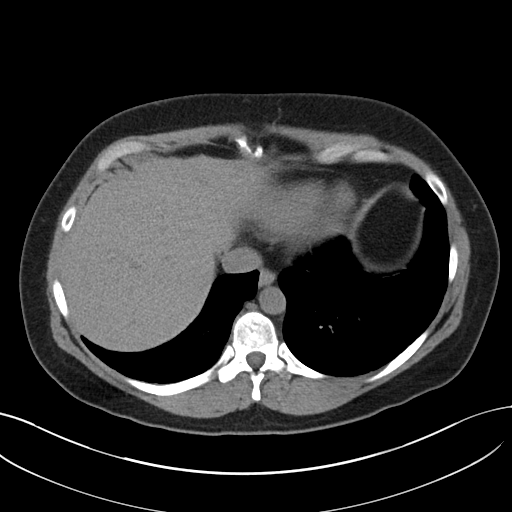
[im 91/96  soft-tissue]
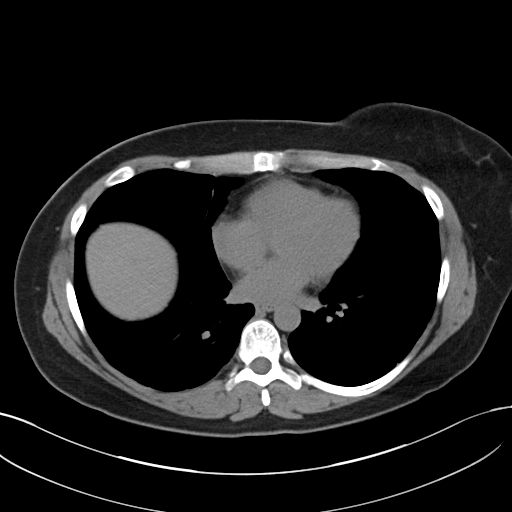

[Series 5: cor st · coronal · 0.88mm/px · 3 of 78 slices shown]
[im 26/78  soft-tissue]
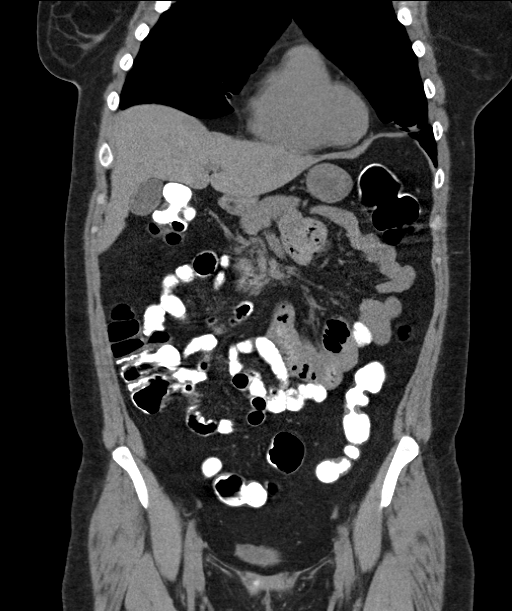
[im 35/78  soft-tissue]
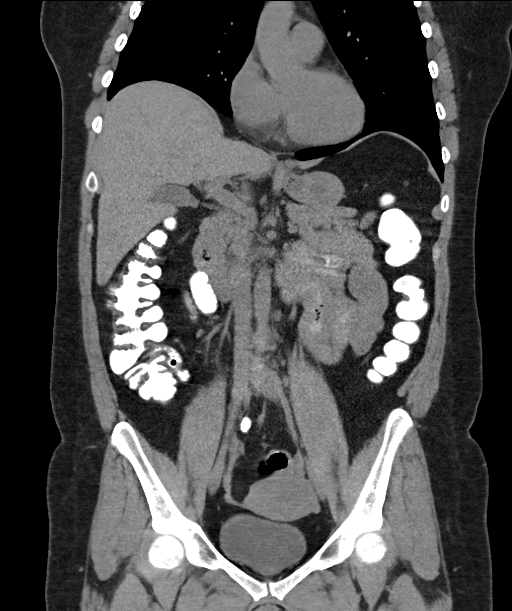
[im 43/78  soft-tissue]
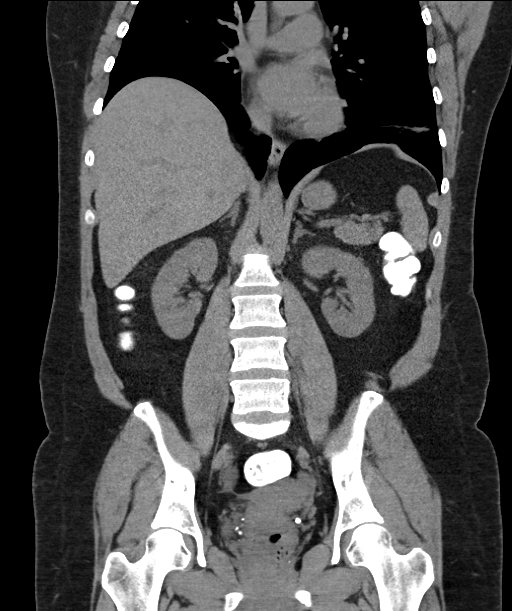

[17 of 46 positions shown; findings below may reference images not displayed]

FINDINGS: There is a 2 mm stone in the upper pole of the left kidney. No right
renal stones. No hydronephrosis. No ureteral or bladder calculi.

There small calcifications in the posterior aspect of the lower
right lobe of the liver consistent with granulomas. Liver parenchyma
is otherwise normal. Biliary tree, spleen, pancreas, and adrenal
glands are normal. The bowel is normal including the terminal ileum
and appendix. No adenopathy. Uterus and ovaries are normal. No free
air or free fluid. No osseous abnormality.

The patient does have slight linear atelectasis at both lung bases.
IMPRESSION: No acute abnormalities of the abdomen or pelvis. Tiny stone in the
upper pole of the left kidney.

## 2014-10-28 ENCOUNTER — Emergency Department (HOSPITAL_COMMUNITY)
Admission: EM | Admit: 2014-10-28 | Discharge: 2014-10-28 | Discharge status: Against Medical Advice | Payer: Self-pay | Attending: Emergency Medicine | Admitting: Emergency Medicine

## 2014-10-28 ENCOUNTER — Encounter (HOSPITAL_COMMUNITY): Payer: Self-pay | Admitting: *Deleted

## 2014-10-28 DIAGNOSIS — R1110 Vomiting, unspecified: Secondary | ICD-10-CM | POA: Insufficient documentation

## 2014-10-28 DIAGNOSIS — R109 Unspecified abdominal pain: Secondary | ICD-10-CM

## 2014-10-28 DIAGNOSIS — Z3202 Encounter for pregnancy test, result negative: Secondary | ICD-10-CM | POA: Insufficient documentation

## 2014-10-28 DIAGNOSIS — R197 Diarrhea, unspecified: Secondary | ICD-10-CM | POA: Insufficient documentation

## 2014-10-28 DIAGNOSIS — R1031 Right lower quadrant pain: Secondary | ICD-10-CM | POA: Insufficient documentation

## 2014-10-28 DIAGNOSIS — R1011 Right upper quadrant pain: Secondary | ICD-10-CM | POA: Insufficient documentation

## 2014-10-28 DIAGNOSIS — Z720 Tobacco use: Secondary | ICD-10-CM | POA: Insufficient documentation

## 2014-10-28 DIAGNOSIS — R6883 Chills (without fever): Secondary | ICD-10-CM | POA: Insufficient documentation

## 2014-10-28 LAB — CBC
HEMATOCRIT: 42.7 % (ref 36.0–46.0)
HEMOGLOBIN: 15 g/dL (ref 12.0–15.0)
MCH: 31.3 pg (ref 26.0–34.0)
MCHC: 35.1 g/dL (ref 30.0–36.0)
MCV: 89.1 fL (ref 78.0–100.0)
PLATELETS: 352 10*3/uL (ref 150–400)
RBC: 4.79 MIL/uL (ref 3.87–5.11)
RDW: 13.1 % (ref 11.5–15.5)
WBC: 9.1 10*3/uL (ref 4.0–10.5)

## 2014-10-28 LAB — COMPREHENSIVE METABOLIC PANEL
ALBUMIN: 3.6 g/dL (ref 3.5–5.0)
ALK PHOS: 89 U/L (ref 38–126)
ALT: 23 U/L (ref 14–54)
AST: 22 U/L (ref 15–41)
Anion gap: 11 (ref 5–15)
BUN: 10 mg/dL (ref 6–20)
CALCIUM: 8.9 mg/dL (ref 8.9–10.3)
CO2: 20 mmol/L — AB (ref 22–32)
Chloride: 110 mmol/L (ref 101–111)
Creatinine, Ser: 0.98 mg/dL (ref 0.44–1.00)
GFR calc Af Amer: >60 mL/min (ref >60)
GLUCOSE: 123 mg/dL — AB (ref 65–99)
POTASSIUM: 3.4 mmol/L — AB (ref 3.5–5.1)
Sodium: 141 mmol/L (ref 135–145)
Total Bilirubin: 0.4 mg/dL (ref 0.3–1.2)
Total Protein: 6.7 g/dL (ref 6.5–8.1)

## 2014-10-28 LAB — PREGNANCY, URINE: PREG TEST UR: NEGATIVE

## 2014-10-28 LAB — URINALYSIS, ROUTINE W REFLEX MICROSCOPIC
BILIRUBIN URINE: NEGATIVE
Glucose, UA: NEGATIVE mg/dL
Hgb urine dipstick: NEGATIVE
Ketones, ur: NEGATIVE mg/dL
LEUKOCYTES UA: NEGATIVE
Nitrite: NEGATIVE
Protein, ur: NEGATIVE mg/dL
Specific Gravity, Urine: 1.027 (ref 1.005–1.030)
UROBILINOGEN UA: 0.2 mg/dL (ref 0.0–1.0)
pH: 5.5 (ref 5.0–8.0)

## 2014-10-28 LAB — LIPASE, BLOOD: Lipase: 60 U/L — ABNORMAL HIGH (ref 22–51)

## 2014-10-28 LAB — GC/CHLAMYDIA PROBE AMP (~~LOC~~) NOT AT ARMC
CHLAMYDIA, DNA PROBE: NEGATIVE
Neisseria Gonorrhea: NEGATIVE

## 2014-10-28 LAB — WET PREP, GENITAL
CLUE CELLS WET PREP: NONE SEEN
TRICH WET PREP: NONE SEEN
Yeast Wet Prep HPF POC: NONE SEEN

## 2014-10-28 MED ORDER — METOCLOPRAMIDE HCL 5 MG/ML IJ SOLN
10.0000 mg | INTRAMUSCULAR | Status: AC
  Administered 2014-10-28: 10 mg via INTRAVENOUS
  Filled 2014-10-28: qty 2

## 2014-10-28 MED ORDER — SODIUM CHLORIDE 0.9 % IV BOLUS (SEPSIS)
1000.0000 mL | Freq: Once | INTRAVENOUS | Status: AC
Start: 2014-10-28 — End: 2014-10-28
  Administered 2014-10-28: 1000 mL via INTRAVENOUS

## 2014-10-28 MED ORDER — MORPHINE SULFATE 4 MG/ML IJ SOLN
4.0000 mg | Freq: Once | INTRAMUSCULAR | Status: AC
  Administered 2014-10-28: 4 mg via INTRAVENOUS
  Filled 2014-10-28: qty 1

## 2014-10-28 MED ORDER — DICYCLOMINE HCL 20 MG PO TABS: 20.0000 mg | ORAL_TABLET | Freq: Two times a day (BID) | ORAL | Status: DC

## 2014-10-28 MED ORDER — ONDANSETRON HCL 4 MG PO TABS: 4.0000 mg | ORAL_TABLET | Freq: Four times a day (QID) | ORAL | Status: DC

## 2014-10-28 MED ORDER — KETOROLAC TROMETHAMINE 30 MG/ML IJ SOLN
30.0000 mg | Freq: Once | INTRAMUSCULAR | Status: AC
  Administered 2014-10-28: 30 mg via INTRAVENOUS
  Filled 2014-10-28: qty 1

## 2014-10-28 MED ORDER — DICYCLOMINE HCL 10 MG/ML IM SOLN
20.0000 mg | Freq: Once | INTRAMUSCULAR | Status: AC
  Administered 2014-10-28: 20 mg via INTRAMUSCULAR
  Filled 2014-10-28: qty 2

## 2014-10-28 NOTE — ED Notes (Signed)
The pt is c/o abd pain and  Chills for 2 hours  n v diarrhea  lmp June 15th

## 2014-10-28 NOTE — Discharge Instructions (Signed)

## 2014-10-28 NOTE — ED Notes (Signed)
Pt ambulated to the bathroom with ease 

## 2014-10-28 NOTE — ED Notes (Signed)
Pt left without her  rxs  And discharge papers

## 2014-10-28 NOTE — ED Notes (Signed)
The pt has signed out ama  Took off all her pulse ox bp cuff and was attempting to take her iv out
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
## 2017-10-06 ENCOUNTER — Ambulatory Visit (HOSPITAL_COMMUNITY)
Admission: RE | Admit: 2017-10-06 | Discharge: 2017-10-06 | Disposition: A | Discharge status: Home / Self Care | Payer: Medicaid Other | Source: Intra-hospital | Attending: Psychiatry | Admitting: Psychiatry

## 2017-10-06 DIAGNOSIS — F1020 Alcohol dependence, uncomplicated: Secondary | ICD-10-CM | POA: Insufficient documentation

## 2017-10-06 DIAGNOSIS — Z1330 Encounter for screening examination for mental health and behavioral disorders, unspecified: Secondary | ICD-10-CM | POA: Insufficient documentation

## 2017-10-06 DIAGNOSIS — F332 Major depressive disorder, recurrent severe without psychotic features: Secondary | ICD-10-CM | POA: Diagnosis not present

## 2017-10-07 ENCOUNTER — Other Ambulatory Visit: Payer: Self-pay

## 2017-10-07 ENCOUNTER — Emergency Department (HOSPITAL_COMMUNITY)
Admission: EM | Admit: 2017-10-07 | Discharge: 2017-10-07 | Disposition: A | Discharge status: Other Acute Inpatient Hospital | Payer: Medicaid Other | Attending: Emergency Medicine | Admitting: Emergency Medicine

## 2017-10-07 ENCOUNTER — Encounter (HOSPITAL_COMMUNITY): Payer: Self-pay | Admitting: *Deleted

## 2017-10-07 ENCOUNTER — Encounter (HOSPITAL_COMMUNITY): Payer: Self-pay

## 2017-10-07 ENCOUNTER — Inpatient Hospital Stay (HOSPITAL_COMMUNITY)
Admission: AD | Admit: 2017-10-07 | Discharge: 2017-10-13 | DRG: 885 | Disposition: A | Discharge status: Home / Self Care | Payer: Medicaid Other | Attending: Psychiatry | Admitting: Psychiatry

## 2017-10-07 DIAGNOSIS — F419 Anxiety disorder, unspecified: Secondary | ICD-10-CM | POA: Diagnosis present

## 2017-10-07 DIAGNOSIS — F3181 Bipolar II disorder: Secondary | ICD-10-CM | POA: Diagnosis not present

## 2017-10-07 DIAGNOSIS — G4700 Insomnia, unspecified: Secondary | ICD-10-CM | POA: Diagnosis not present

## 2017-10-07 DIAGNOSIS — Z915 Personal history of self-harm: Secondary | ICD-10-CM

## 2017-10-07 DIAGNOSIS — R45851 Suicidal ideations: Secondary | ICD-10-CM | POA: Diagnosis present

## 2017-10-07 DIAGNOSIS — F1410 Cocaine abuse, uncomplicated: Secondary | ICD-10-CM | POA: Insufficient documentation

## 2017-10-07 DIAGNOSIS — R45850 Homicidal ideations: Secondary | ICD-10-CM | POA: Diagnosis present

## 2017-10-07 DIAGNOSIS — F1920 Other psychoactive substance dependence, uncomplicated: Secondary | ICD-10-CM

## 2017-10-07 DIAGNOSIS — Z639 Problem related to primary support group, unspecified: Secondary | ICD-10-CM | POA: Diagnosis not present

## 2017-10-07 DIAGNOSIS — F1110 Opioid abuse, uncomplicated: Secondary | ICD-10-CM | POA: Insufficient documentation

## 2017-10-07 LAB — COMPREHENSIVE METABOLIC PANEL
ALBUMIN: 4.2 g/dL (ref 3.5–5.0)
ALK PHOS: 78 U/L (ref 38–126)
ALT: 24 U/L (ref 14–54)
AST: 18 U/L (ref 15–41)
Anion gap: 11 (ref 5–15)
BUN: 10 mg/dL (ref 6–20)
CALCIUM: 9 mg/dL (ref 8.9–10.3)
CO2: 19 mmol/L — ABNORMAL LOW (ref 22–32)
CREATININE: 0.98 mg/dL (ref 0.44–1.00)
Chloride: 113 mmol/L — ABNORMAL HIGH (ref 101–111)
GFR calc non Af Amer: >60 mL/min (ref >60)
GLUCOSE: 113 mg/dL — AB (ref 65–99)
Potassium: 4 mmol/L (ref 3.5–5.1)
Sodium: 143 mmol/L (ref 135–145)
Total Bilirubin: 0.2 mg/dL — ABNORMAL LOW (ref 0.3–1.2)
Total Protein: 8.2 g/dL — ABNORMAL HIGH (ref 6.5–8.1)

## 2017-10-07 LAB — CBC WITH DIFFERENTIAL/PLATELET
BASOS ABS: 0.1 10*3/uL (ref 0.0–0.1)
BASOS PCT: 1 %
EOS ABS: 0.1 10*3/uL (ref 0.0–0.7)
Eosinophils Relative: 1 %
HCT: 48.4 % — ABNORMAL HIGH (ref 36.0–46.0)
HEMOGLOBIN: 16.6 g/dL — AB (ref 12.0–15.0)
Lymphocytes Relative: 23 %
Lymphs Abs: 2.5 10*3/uL (ref 0.7–4.0)
MCH: 31.1 pg (ref 26.0–34.0)
MCHC: 34.3 g/dL (ref 30.0–36.0)
MCV: 90.8 fL (ref 78.0–100.0)
MONOS PCT: 5 %
Monocytes Absolute: 0.6 10*3/uL (ref 0.1–1.0)
NEUTROS ABS: 7.4 10*3/uL (ref 1.7–7.7)
NEUTROS PCT: 70 %
Platelets: 414 10*3/uL — ABNORMAL HIGH (ref 150–400)
RBC: 5.33 MIL/uL — ABNORMAL HIGH (ref 3.87–5.11)
RDW: 12.6 % (ref 11.5–15.5)
WBC: 10.7 10*3/uL — AB (ref 4.0–10.5)

## 2017-10-07 LAB — RAPID URINE DRUG SCREEN, HOSP PERFORMED
Amphetamines: NOT DETECTED
BENZODIAZEPINES: NOT DETECTED
COCAINE: POSITIVE — AB
Opiates: POSITIVE — AB
TETRAHYDROCANNABINOL: NOT DETECTED

## 2017-10-07 LAB — ETHANOL: Alcohol, Ethyl (B): 87 mg/dL — ABNORMAL HIGH (ref <10)

## 2017-10-07 LAB — SALICYLATE LEVEL: Salicylate Lvl: <7 mg/dL (ref 2.8–30.0)

## 2017-10-07 LAB — POC URINE PREG, ED: Preg Test, Ur: NEGATIVE

## 2017-10-07 LAB — ACETAMINOPHEN LEVEL: Acetaminophen (Tylenol), Serum: <10 ug/mL — ABNORMAL LOW (ref 10–30)

## 2017-10-07 MED ORDER — ALUM & MAG HYDROXIDE-SIMETH 200-200-20 MG/5ML PO SUSP: 30.0000 mL | Freq: Four times a day (QID) | ORAL | Status: DC | PRN

## 2017-10-07 MED ORDER — GABAPENTIN 300 MG PO CAPS
300.0000 mg | ORAL_CAPSULE | Freq: Two times a day (BID) | ORAL | Status: DC
  Administered 2017-10-07 – 2017-10-12 (×10): 300 mg via ORAL
  Filled 2017-10-07 (×14): qty 1

## 2017-10-07 MED ORDER — NICOTINE 21 MG/24HR TD PT24
21.0000 mg | MEDICATED_PATCH | Freq: Every day | TRANSDERMAL | Status: DC
  Administered 2017-10-08 – 2017-10-13 (×6): 21 mg via TRANSDERMAL
  Filled 2017-10-07 (×8): qty 1

## 2017-10-07 MED ORDER — NICOTINE POLACRILEX 2 MG MT GUM
2.0000 mg | CHEWING_GUM | OROMUCOSAL | Status: DC | PRN
  Administered 2017-10-07: 2 mg via ORAL
  Filled 2017-10-07 (×2): qty 1

## 2017-10-07 MED ORDER — IBUPROFEN 200 MG PO TABS: 600.0000 mg | ORAL_TABLET | Freq: Three times a day (TID) | ORAL | Status: DC | PRN

## 2017-10-07 MED ORDER — HYDROXYZINE HCL 50 MG PO TABS
50.0000 mg | ORAL_TABLET | Freq: Four times a day (QID) | ORAL | Status: DC | PRN
  Administered 2017-10-08 – 2017-10-13 (×15): 50 mg via ORAL
  Filled 2017-10-07 (×10): qty 1
  Filled 2017-10-07: qty 10
  Filled 2017-10-07 (×5): qty 1

## 2017-10-07 MED ORDER — TRAZODONE HCL 100 MG PO TABS: 100.0000 mg | ORAL_TABLET | Freq: Every day | ORAL | Status: DC

## 2017-10-07 MED ORDER — TRAZODONE HCL 100 MG PO TABS
100.0000 mg | ORAL_TABLET | Freq: Every day | ORAL | Status: DC
  Administered 2017-10-07 – 2017-10-12 (×6): 100 mg via ORAL
  Filled 2017-10-07 (×6): qty 1
  Filled 2017-10-07 (×2): qty 7
  Filled 2017-10-07 (×2): qty 1

## 2017-10-07 MED ORDER — NICOTINE 21 MG/24HR TD PT24
21.0000 mg | MEDICATED_PATCH | Freq: Every day | TRANSDERMAL | Status: DC
  Administered 2017-10-07: 21 mg via TRANSDERMAL

## 2017-10-07 MED ORDER — ALUM & MAG HYDROXIDE-SIMETH 200-200-20 MG/5ML PO SUSP: 30.0000 mL | ORAL | Status: DC | PRN

## 2017-10-07 MED ORDER — IBUPROFEN 600 MG PO TABS: 600.0000 mg | ORAL_TABLET | Freq: Three times a day (TID) | ORAL | Status: DC | PRN

## 2017-10-07 MED ORDER — ONDANSETRON HCL 4 MG PO TABS: 4.0000 mg | ORAL_TABLET | Freq: Three times a day (TID) | ORAL | Status: DC | PRN

## 2017-10-07 MED ORDER — TRAZODONE HCL 50 MG PO TABS
50.0000 mg | ORAL_TABLET | Freq: Every evening | ORAL | Status: DC | PRN
  Filled 2017-10-07: qty 1

## 2017-10-07 MED ORDER — HYDROXYZINE HCL 25 MG PO TABS: 25.0000 mg | ORAL_TABLET | Freq: Three times a day (TID) | ORAL | Status: DC | PRN

## 2017-10-07 MED ORDER — GABAPENTIN 300 MG PO CAPS
300.0000 mg | ORAL_CAPSULE | Freq: Two times a day (BID) | ORAL | Status: DC
  Administered 2017-10-07: 300 mg via ORAL
  Filled 2017-10-07: qty 1

## 2017-10-07 MED ORDER — ACETAMINOPHEN 325 MG PO TABS: 650.0000 mg | ORAL_TABLET | Freq: Four times a day (QID) | ORAL | Status: DC | PRN

## 2017-10-07 MED ORDER — HYDROXYZINE HCL 25 MG PO TABS
25.0000 mg | ORAL_TABLET | Freq: Three times a day (TID) | ORAL | Status: DC | PRN
  Administered 2017-10-07: 25 mg via ORAL
  Filled 2017-10-07: qty 1

## 2017-10-07 MED ORDER — ZOLPIDEM TARTRATE 5 MG PO TABS
5.0000 mg | ORAL_TABLET | Freq: Every evening | ORAL | Status: DC | PRN
  Administered 2017-10-07: 5 mg via ORAL
  Filled 2017-10-07: qty 1

## 2017-10-07 MED ORDER — MAGNESIUM HYDROXIDE 400 MG/5ML PO SUSP: 30.0000 mL | Freq: Every day | ORAL | Status: DC | PRN

## 2017-10-07 MED ORDER — HYDROXYZINE HCL 25 MG PO TABS
25.0000 mg | ORAL_TABLET | Freq: Once | ORAL | Status: AC
  Administered 2017-10-07: 25 mg via ORAL
  Filled 2017-10-07 (×2): qty 1

## 2017-10-07 NOTE — [facility] Counselor (Signed)
[facility]. Discussed disposition with Misty StanleyLisa, [place].   Redmond Pullingreylese D Zetta Stoneman, MS, [facility], [facility] Triage Specialist [phone]
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
## 2017-10-07 NOTE — ED Notes (Signed)
Bed: [id number] Expected date:  Expected time:  Means of arrival:  [facility]
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
## 2017-10-07 NOTE — ED Notes (Signed)
Pt presents with SI, plan to stab self in the neck, denies HI or AVH, feeling hopeless.  Denies SI attempts in the past.  Pt diagnosed with Bipolar DO, states Abilify medication has made her gain weight, so she stopped taking med for the past 2-3 mos.  A&O x 3, no distress noted, calm & cooperative, monitoring for safety, Q 15 min checks in effect.
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
## 2017-10-07 NOTE — Tx Team (Signed)
Initial Treatment Plan 10/07/2017 8:55 PM Renee Marshall NGE:[id number]    PATIENT STRESSORS: Educational concerns Financial difficulties Health problems Legal issue   PATIENT STRENGTHS: Ability for insight Active sense of humor Average or above average intelligence   PATIENT IDENTIFIED PROBLEMS: Bipolar  Polysubstance Abuse              " I want to get better"  "I know I need help"   DISCHARGE CRITERIA:  Ability to meet basic life and health needs Adequate post-discharge living arrangements Improved stabilization in mood, thinking, and/or behavior  PRELIMINARY DISCHARGE PLAN: Attend aftercare/continuing care group Attend PHP/IOP Attend 12-step recovery group  PATIENT/FAMILY INVOLVEMENT: This treatment plan has been presented to and reviewed with the patient, Renee Marshall, and/or family member,.  The patient and family have been given the opportunity to ask questions and make suggestions.  Rich Braveuke, Xitlalli Newhard Lynn, RN 10/07/2017, 8:55 PM

## 2017-10-07 NOTE — ED Triage Notes (Addendum)
[facility] [employer]. Patient having suicidal thoughts with plan of stabbing herself in the neck. Tearful and states she quit taking meds 2 months ago because they made her gain weight.
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
## 2017-10-07 NOTE — ED Notes (Signed)
Report called to Shelda JakesPatty Duke RN [facility].

## 2017-10-07 NOTE — ED Notes (Signed)
Pt transported to [facility] by [employer]. All belongings returned to pt who signed for same. Pt was calm and cooperative.  

## 2017-10-08 DIAGNOSIS — F3181 Bipolar II disorder: Principal | ICD-10-CM

## 2017-10-08 MED ORDER — VITAMIN B-1 100 MG PO TABS
100.0000 mg | ORAL_TABLET | Freq: Every day | ORAL | Status: DC
  Administered 2017-10-09 – 2017-10-13 (×5): 100 mg via ORAL
  Filled 2017-10-08: qty 1
  Filled 2017-10-08: qty 7
  Filled 2017-10-08 (×4): qty 1
  Filled 2017-10-08: qty 7

## 2017-10-08 MED ORDER — CLONIDINE HCL 0.1 MG PO TABS
0.1000 mg | ORAL_TABLET | ORAL | Status: AC
  Administered 2017-10-10 – 2017-10-12 (×4): 0.1 mg via ORAL
  Filled 2017-10-08 (×4): qty 1

## 2017-10-08 MED ORDER — ADULT MULTIVITAMIN W/MINERALS CH
1.0000 | ORAL_TABLET | Freq: Every day | ORAL | Status: DC
  Administered 2017-10-08 – 2017-10-13 (×6): 1 via ORAL
  Filled 2017-10-08 (×9): qty 1

## 2017-10-08 MED ORDER — CHLORDIAZEPOXIDE HCL 25 MG PO CAPS
25.0000 mg | ORAL_CAPSULE | Freq: Three times a day (TID) | ORAL | Status: AC
  Administered 2017-10-09 (×3): 25 mg via ORAL
  Filled 2017-10-08 (×3): qty 1

## 2017-10-08 MED ORDER — DICYCLOMINE HCL 20 MG PO TABS: 20.0000 mg | ORAL_TABLET | Freq: Four times a day (QID) | ORAL | Status: AC | PRN

## 2017-10-08 MED ORDER — CHLORDIAZEPOXIDE HCL 25 MG PO CAPS
25.0000 mg | ORAL_CAPSULE | Freq: Four times a day (QID) | ORAL | Status: AC | PRN
  Administered 2017-10-10: 25 mg via ORAL
  Filled 2017-10-08: qty 1

## 2017-10-08 MED ORDER — CHLORDIAZEPOXIDE HCL 25 MG PO CAPS
25.0000 mg | ORAL_CAPSULE | Freq: Four times a day (QID) | ORAL | Status: AC
  Administered 2017-10-08 (×3): 25 mg via ORAL
  Filled 2017-10-08 (×3): qty 1

## 2017-10-08 MED ORDER — CHLORDIAZEPOXIDE HCL 25 MG PO CAPS
25.0000 mg | ORAL_CAPSULE | Freq: Every day | ORAL | Status: AC
  Administered 2017-10-11: 25 mg via ORAL
  Filled 2017-10-08: qty 1

## 2017-10-08 MED ORDER — DULOXETINE HCL 20 MG PO CPEP
20.0000 mg | ORAL_CAPSULE | Freq: Every day | ORAL | Status: DC
  Administered 2017-10-08 – 2017-10-10 (×3): 20 mg via ORAL
  Filled 2017-10-08 (×6): qty 1

## 2017-10-08 MED ORDER — CLONIDINE HCL 0.1 MG PO TABS
0.1000 mg | ORAL_TABLET | Freq: Every day | ORAL | Status: AC
  Administered 2017-10-11 – 2017-10-13 (×2): 0.1 mg via ORAL
  Filled 2017-10-08 (×3): qty 1

## 2017-10-08 MED ORDER — METHOCARBAMOL 500 MG PO TABS
500.0000 mg | ORAL_TABLET | Freq: Three times a day (TID) | ORAL | Status: AC | PRN
  Administered 2017-10-11 – 2017-10-13 (×4): 500 mg via ORAL
  Filled 2017-10-08 (×4): qty 1

## 2017-10-08 MED ORDER — CLONIDINE HCL 0.1 MG PO TABS
0.1000 mg | ORAL_TABLET | Freq: Four times a day (QID) | ORAL | Status: AC
  Administered 2017-10-08 – 2017-10-09 (×5): 0.1 mg via ORAL
  Filled 2017-10-08 (×9): qty 1

## 2017-10-08 MED ORDER — THIAMINE HCL 100 MG/ML IJ SOLN: 100.0000 mg | Freq: Once | INTRAMUSCULAR | Status: DC

## 2017-10-08 MED ORDER — LOPERAMIDE HCL 2 MG PO CAPS: 2.0000 mg | ORAL_CAPSULE | ORAL | Status: AC | PRN

## 2017-10-08 MED ORDER — NAPROXEN 500 MG PO TABS
500.0000 mg | ORAL_TABLET | Freq: Two times a day (BID) | ORAL | Status: AC | PRN
  Administered 2017-10-08 – 2017-10-12 (×4): 500 mg via ORAL
  Filled 2017-10-08 (×4): qty 1

## 2017-10-08 MED ORDER — CHLORDIAZEPOXIDE HCL 25 MG PO CAPS
25.0000 mg | ORAL_CAPSULE | ORAL | Status: AC
  Administered 2017-10-10 (×2): 25 mg via ORAL
  Filled 2017-10-08 (×2): qty 1

## 2017-10-08 NOTE — Progress Notes (Signed)
Patient did attend the first part of the evening speaker AA meeting but returned to her room after 30 min reporting withdrawal discomfort.
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
## 2017-10-08 NOTE — Progress Notes (Signed)
Patient ID: [name], female   DOB: 10/10/1980, 37 y.o.   MRN: [id number] Pt observed in dayroom interacting with peers. Pt complained of severe anxiety, moderate depression and insomnia, "I have slept for several days and I think that's is one of the reason I'm so anxious." Pt denied pain, SI/HI of AVH. Medication offered as prescribed. All patient's questions and concerns addressed. Support, encouragement, and safe environment provided. 15-minute safety checks continue. Pt was med compliant. Pt attended part of AA group.

## 2017-10-08 NOTE — Progress Notes (Signed)
Pt attended AA meeting.  
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
## 2017-10-08 NOTE — Progress Notes (Signed)
Patient ID: Renee Marshall, female   DOB: 02/02/1981, 37 y.o.   MRN: [id number]  Pt currently presents with a flat affect and cooperative behavior. Presents with signs and symptoms of withdrawal including fatigue, flu-like symptoms and anxiety. Reports ongoing nicotine withdrawal symptoms. Pt states goal is to "just feel better." She intends to do so by "sleep(ing)."  Pt provided with medications per providers orders. Pt's labs and vitals were monitored during the shift. Pt given a 1:1 about emotional and mental status. Pt supported and encouraged to express concerns and questions. Pt educated on medications and coping skills for anxiety.   Pt's safety ensured with 15 minute and environmental checks. Pt currently denies SI/HI and A/V hallucinations. Pt verbally agrees to seek staff if SI/HI or A/VH occurs and to consult with staff before acting on any harmful thoughts. Will continue POC.
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
## 2017-10-09 NOTE — Progress Notes (Signed)
Patient ID: Renee SorensonLacy Rose Marshall, female   DOB: 03/04/1981, 37 y.o.   MRN: [id number]  Pt currently presents with a flat affect and cooperative behavior. Presents with signs and symptoms of withdrawal including malaise, fatigue, sweating, and anxiety. Reports ongoing cravings and hopelessness. Pt states goal is "getting better." And she will do so by "being productive." Pt activity increased today.   Pt provided with medications per providers orders. Pt's labs and vitals were monitored during the shift. Pt given a 1:1 about emotional and mental status. Pt supported and encouraged to express concerns and questions. Pt educated on medications and opiate withdrawal.   Pt's safety ensured with 15 minute and environmental checks. Pt currently denies SI/HI and A/V hallucinations. Pt verbally agrees to seek staff if SI/HI or A/VH occurs and to consult with staff before acting on any harmful thoughts. Will continue POC.

## 2017-10-09 NOTE — Progress Notes (Signed)
Patient ID: Renee Marshall, female   DOB: 11/02/1980, 37 y.o.   MRN: [id number] D: Pt observer in the dayroom withdrawn to self. Pt complained about several withdrawal symptom: "I have pain all over my body, feel feverish, abdominal pain, and shakiness." Pt also complained of severe anxiety with moderate depression. Pt however; denied SI/HI or AVH. A: Medications offered as prescribed. Pt given the opportunity to ask questions and state concerns. Support, encouragement, and safe environment provided. R: Pt was med compliant. All patient's questions and concerns addressed. 15-minute safety checks continue. Safety checks continue. Pt attended AA group.

## 2017-10-09 NOTE — [facility] Suicide Risk Assessment (Addendum)
[facility] INPATIENT:  Family/Significant Other Suicide Prevention Education  Suicide Prevention Education:  Education Completed; sister Rachael DarbyDiana Bost [phone] has been identified by the patient as the family member/significant other with whom the patient will be residing, and identified as the person(s) who will aid the patient in the event of a mental health crisis (suicidal ideations/suicide attempt).    With written consent from the patient, the family member/significant other has been contacted about providing the following suicide prevention education, and the sister stated that she is a clinical social worker herself, already knows this information and declined for it to be provided further.  She did state she has no further concerns about the patient other than her getting well prior to discharge and no longer having SI/HI.  Sister acknowledged patient's problematic relationship with boyfriend, and stated she is not aware of patient's plan to live with her for awhile at discharge but that would be fine, and they will discuss it.  Suicide Prevention Education was reviewed thoroughly with patient, including risk factors, warning signs, and what to do.  Mobile Crisis services were described and that telephone number pointed out, with encouragement to patient to put this number in personal cell phone.  Brochure was provided to patient to share with natural supports.  Patient acknowledged the ways in which they are at risk, and how working through each of their issues can gradually start to reduce their risk factors.  Patient was encouraged to think of the information in the context of people in their own lives.  Patient denied having access to firearms  Patient verbalized understanding of information provided.  Patient endorsed a desire to live.    The suicide prevention education provided includes the following:  Suicide risk factors  Suicide prevention and interventions  National Suicide Hotline  telephone number  [facility] assessment telephone number  [facility] Assistance 911  [facility] and/or Residential [facility] Crisis Unit telephone number  Request made of family/significant other to:  Remove weapons (e.g., guns, rifles, knives), all items previously/currently identified as safety concern.    Remove drugs/medications (over-the-counter, prescriptions, illicit drugs), all items previously/currently identified as a safety concern.  The family member/significant other verbalizes understanding of the suicide prevention education information provided.  The family member/significant other agrees to remove the items of safety concern listed above.  Renee JaegerMareida J Marshall 10/09/2017, 4:35 PM

## 2017-10-09 NOTE — [facility] Group Notes (Signed)
[facility] Group Notes:  (Nursing)  Date:  10/09/2017  Time: 1:15 PM Type of Therapy:  Nurse Education  Participation Level:  Did Not Attend  Participation Quality:  Did not attend  Affect:  Did not attend  Cognitive:  Did not attend  Insight:  None  Engagement in Group:  None  Modes of Intervention:  Education  Summary of Progress/Problems:  Renee Marshall 10/09/2017, 3:22 PM

## 2017-10-09 NOTE — Progress Notes (Signed)
Pt attended goals and orientation group this morning. Pt goal for the day is get better and get through the day

## 2017-10-10 MED ORDER — DULOXETINE HCL 30 MG PO CPEP
30.0000 mg | ORAL_CAPSULE | Freq: Every day | ORAL | Status: DC
  Administered 2017-10-11: 30 mg via ORAL
  Filled 2017-10-10 (×3): qty 1

## 2017-10-10 NOTE — Progress Notes (Signed)
D: Pt with a worried affect observed in the dayroom not interacting. Pt continue to complain of moderate anxiety, depression and shakiness, "I could not get my clonidine earlier because my b/p was low." Pt denied SI/HI or AVH. A: Medications offered as prescribed. Pt given the opportunity to ask questions and state concerns. Support, encouragement, and safe environment provided. R: Pt was med compliant. All patient's questions and concerns addressed. 15-minute safety checks continue. Safety checks continue. Pt attended AA group.

## 2017-10-10 NOTE — Progress Notes (Signed)
Recreation Therapy Notes  Date: 6.24.19 Time: 0930 Location: 300 Hall Dayroom  Group Topic: Stress Management  Goal Area(s) Addresses:  Patient will verbalize importance of using healthy stress management.  Patient will identify positive emotions associated with healthy stress management.   Behavioral Response: Engaged  Intervention: Stress Management  Activity :  Guided Imagery.  LRT introduced the stress management technique of guided imagery to the group.  LRT read [place] script that took the patients on [place] journey [place] the beach.  Patients were to follow along as the script was read to engage in the activity.  Education:  Stress Management, Discharge Planning.   Education Outcome: Acknowledges edcuation/In group clarification offered/Needs additional education  Clinical Observations/Feedback: Pt attended and participated in group.     Renee Marshall, Renee Marshall         Renee Marshall, Renee Marshall 10/10/2017 11:19 AM

## 2017-10-10 NOTE — Progress Notes (Signed)
Pt on unit in dayroom but does not attend group therapy.  Pt is med compliant.  Pt asks for anxiety med.  Pt denies SI, HI and AVH.  Pt contracts for safety.  Pt denies pain or discomfort.  Pt gets snack and goes to bed. Pt remains safe on unit

## 2017-10-10 NOTE — Progress Notes (Signed)
Adult Psychoeducational Group Note  Date:  10/10/2017 Time:  8:59 AM  Group Topic/Focus:  Orientation:   The focus of this group is to educate the patient on the purpose and policies of crisis stabilization and provide a format to answer questions about their admission.  The group details unit policies and expectations of patients while admitted.  Participation Level:  Active  Participation Quality:  Appropriate  Affect:  Appropriate  Cognitive:  Appropriate  Insight: Good  Engagement in Group:  Engaged  Modes of Intervention:  Discussion  Additional Comments:  Pt attended group. Pt talked about their goal, Pt talked about the schedule, Introduced the staff, talked about expectations and talked about being supportive to one another.  Wyn ForsterLeonard B Justyna Timoney 10/10/2017, 8:59 AM
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
## 2017-10-10 NOTE — [facility] Group Notes (Signed)
LCSW Group Therapy Note   10/10/2017 1:15pm   Type of Therapy and Topic:  Group Therapy:  Overcoming Obstacles   Participation Level:  Did Not Attend--pt invited. Chose to remain in bed.    Description of Group:    In this group patients will be encouraged to explore what they see as obstacles to their own wellness and recovery. They will be guided to discuss their thoughts, feelings, and behaviors related to these obstacles. The group will process together ways to cope with barriers, with attention given to specific choices patients can make. Each patient will be challenged to identify changes they are motivated to make in order to overcome their obstacles. This group will be process-oriented, with patients participating in exploration of their own experiences as well as giving and receiving support and challenge from other group members.   Therapeutic Goals: 1. Patient will identify personal and current obstacles as they relate to admission. 2. Patient will identify barriers that currently interfere with their wellness or overcoming obstacles.  3. Patient will identify feelings, thought process and behaviors related to these barriers. 4. Patient will identify two changes they are willing to make to overcome these obstacles:      Summary of Patient Progress   x   Therapeutic Modalities:   Cognitive Behavioral Therapy Solution Focused Therapy Motivational Interviewing Relapse Prevention Therapy  Rona RavensHeather S Sharif Rendell, LCSW 10/10/2017 2:46 PM

## 2017-10-10 NOTE — Progress Notes (Signed)
Patient ID: Renee SorensonLacy Rose Marshall, female   DOB: 07/31/1980, 37 y.o.   MRN: [id number]  Pt currently presents with a flat affect and depressed behavior. Reports ongoing anxiety and depression. Presents with signs and symptoms of withdrawal including agitation, cramping and diarrhea. Pt states goal is to "get out of here." Intends to do so by "just listen."   Pt provided with medications per providers orders. Pt's labs and vitals were monitored during the shift. Pt given a 1:1 about emotional and mental status. Pt supported and encouraged to express concerns and questions. Pt educated on medications.  Pt's safety ensured with 15 minute and environmental checks. Pt currently denies SI/HI and A/V hallucinations. Pt verbally agrees to seek staff if SI/HI or A/VH occurs and to consult with staff before acting on any harmful thoughts. Will continue POC.

## 2017-10-10 NOTE — Tx Team (Signed)
Interdisciplinary Treatment and Diagnostic Plan Update  10/10/2017 Time of Session: 0830AM Renee Marshall MRN: [id number]  Principal Diagnosis: Bipolar II disorder [facility])  Secondary Diagnoses: Principal Problem:   Bipolar II disorder (HCC) Active Problems:   Polysubstance (including opioids) dependence with physiological dependence (HCC)   Current Medications:  Current Facility-Administered Medications  Medication Dose Route Frequency Provider Last Rate Last Dose  . acetaminophen (TYLENOL) tablet 650 mg  650 mg Oral Q6H PRN Antonieta Pert, MD      . alum & mag hydroxide-simeth (MAALOX/MYLANTA) 200-200-20 MG/5ML suspension 30 mL  30 mL Oral Q4H PRN Laveda Abbe, NP      . chlordiazePOXIDE (LIBRIUM) capsule 25 mg  25 mg Oral Q6H PRN Money, Gerlene Burdock, FNP      . chlordiazePOXIDE (LIBRIUM) capsule 25 mg  25 mg Oral BH-qamhs Money, Gerlene Burdock, FNP   25 mg at 10/10/17 0865   Followed by  . [START ON 10/11/2017] chlordiazePOXIDE (LIBRIUM) capsule 25 mg  25 mg Oral Daily Money, Travis B, FNP      . cloNIDine (CATAPRES) tablet 0.1 mg  0.1 mg Oral BH-qamhs Money, Feliz Beam B, FNP   0.1 mg at 10/10/17 0806   Followed by  . [START ON 10/12/2017] cloNIDine (CATAPRES) tablet 0.1 mg  0.1 mg Oral QAC breakfast Money, Feliz Beam B, FNP      . dicyclomine (BENTYL) tablet 20 mg  20 mg Oral Q6H PRN Money, Gerlene Burdock, FNP      . DULoxetine (CYMBALTA) DR capsule 20 mg  20 mg Oral Daily Antonieta Pert, MD   20 mg at 10/10/17 0806  . gabapentin (NEURONTIN) capsule 300 mg  300 mg Oral BID Laveda Abbe, NP   300 mg at 10/10/17 7846  . hydrOXYzine (ATARAX/VISTARIL) tablet 50 mg  50 mg Oral Q6H PRN Kerry Hough, PA-C   50 mg at 10/10/17 9629  . loperamide (IMODIUM) capsule 2-4 mg  2-4 mg Oral PRN Money, Gerlene Burdock, FNP      . magnesium hydroxide (MILK OF MAGNESIA) suspension 30 mL  30 mL Oral Daily PRN Laveda Abbe, NP      . methocarbamol (ROBAXIN) tablet 500 mg  500 mg Oral Q8H PRN  Money, Gerlene Burdock, FNP      . multivitamin with minerals tablet 1 tablet  1 tablet Oral Daily Money, Gerlene Burdock, FNP   1 tablet at 10/10/17 5284  . naproxen (NAPROSYN) tablet 500 mg  500 mg Oral BID PRN Money, Gerlene Burdock, FNP   500 mg at 10/08/17 2117  . nicotine (NICODERM CQ - dosed in mg/24 hours) patch 21 mg  21 mg Transdermal Daily Laveda Abbe, NP   21 mg at 10/10/17 0807  . ondansetron (ZOFRAN) tablet 4 mg  4 mg Oral Q8H PRN Laveda Abbe, NP      . thiamine (B-1) injection 100 mg  100 mg Intramuscular Once Money, Feliz Beam B, FNP      . thiamine (VITAMIN B-1) tablet 100 mg  100 mg Oral Daily Money, Gerlene Burdock, FNP   100 mg at 10/10/17 0806  . traZODone (DESYREL) tablet 100 mg  100 mg Oral QHS Laveda Abbe, NP   100 mg at 10/09/17 2208   PTA Medications: Medications Prior to Admission  Medication Sig Dispense Refill Last Dose  . acetaminophen (TYLENOL) 500 MG tablet Take 1,000 mg by mouth every 6 (six) hours as needed for mild pain.   More than a month at  Unknown time  . dicyclomine (BENTYL) 20 MG tablet Take 1 tablet (20 mg total) by mouth 2 (two) times daily. (Patient not taking: Reported on 10/07/2017) 20 tablet 0 More than a month at Unknown time  . ibuprofen (ADVIL,MOTRIN) 200 MG tablet Take 200 mg by mouth every 6 (six) hours as needed for moderate pain.   Unknown at Unknown time  . ondansetron (ZOFRAN) 4 MG tablet Take 1 tablet (4 mg total) by mouth every 6 (six) hours. (Patient not taking: Reported on 10/07/2017) 12 tablet 0 Unknown at Unknown time    Patient Stressors: Educational concerns Financial difficulties Health problems Legal issue  Patient Strengths: Ability for insight Active sense of humor Average or above average intelligence  Treatment Modalities: Medication Management, Group therapy, Case management,  1 to 1 session with clinician, Psychoeducation, Recreational therapy.   Physician Treatment Plan for Primary Diagnosis: Bipolar II disorder  (HCC) Long Term Goal(s): Improvement in symptoms so as ready for discharge Improvement in symptoms so as ready for discharge   Short Term Goals: Ability to identify changes in lifestyle to reduce recurrence of condition will improve Ability to verbalize feelings will improve Ability to disclose and discuss suicidal ideas Ability to demonstrate self-control will improve Ability to identify and develop effective coping behaviors will improve Ability to maintain clinical measurements within normal limits will improve Compliance with prescribed medications will improve Ability to identify triggers associated with substance abuse/mental health issues will improve  Medication Management: Evaluate patient's response, side effects, and tolerance of medication regimen.  Therapeutic Interventions: 1 to 1 sessions, Unit Group sessions and Medication administration.  Evaluation of Outcomes: Progressing  Physician Treatment Plan for Secondary Diagnosis: Principal Problem:   Bipolar II disorder (HCC) Active Problems:   Polysubstance (including opioids) dependence with physiological dependence (HCC)  Long Term Goal(s): Improvement in symptoms so as ready for discharge Improvement in symptoms so as ready for discharge   Short Term Goals: Ability to identify changes in lifestyle to reduce recurrence of condition will improve Ability to verbalize feelings will improve Ability to disclose and discuss suicidal ideas Ability to demonstrate self-control will improve Ability to identify and develop effective coping behaviors will improve Ability to maintain clinical measurements within normal limits will improve Compliance with prescribed medications will improve Ability to identify triggers associated with substance abuse/mental health issues will improve     Medication Management: Evaluate patient's response, side effects, and tolerance of medication regimen.  Therapeutic Interventions: 1 to 1  sessions, Unit Group sessions and Medication administration.  Evaluation of Outcomes: Progressing   RN Treatment Plan for Primary Diagnosis: Bipolar II disorder (HCC) Long Term Goal(s): Knowledge of disease and therapeutic regimen to maintain health will improve  Short Term Goals: Ability to remain free from injury will improve, Ability to disclose and discuss suicidal ideas and Ability to identify and develop effective coping behaviors will improve  Medication Management: RN will administer medications as ordered by provider, will assess and evaluate patient's response and provide education to patient for prescribed medication. RN will report any adverse and/or side effects to prescribing provider.  Therapeutic Interventions: 1 on 1 counseling sessions, Psychoeducation, Medication administration, Evaluate responses to treatment, Monitor vital signs and CBGs as ordered, Perform/monitor CIWA, COWS, AIMS and Fall Risk screenings as ordered, Perform wound care treatments as ordered.  Evaluation of Outcomes: Progressing   LCSW Treatment Plan for Primary Diagnosis: Bipolar II disorder (HCC) Long Term Goal(s): Safe transition to appropriate next level of care at discharge, Engage patient  in therapeutic group addressing interpersonal concerns.  Short Term Goals: Engage patient in aftercare planning with referrals and resources, Facilitate patient progression through stages of change regarding substance use diagnoses and concerns and Identify triggers associated with mental health/substance abuse issues  Therapeutic Interventions: Assess for all discharge needs, 1 to 1 time with Social worker, Explore available resources and support systems, Assess for adequacy in community support network, Educate family and significant other(s) on suicide prevention, Complete Psychosocial Assessment, Interpersonal group therapy.  Evaluation of Outcomes: Progressing   Progress in Treatment: Attending groups:  Yes. Participating in groups: Yes. Taking medication as prescribed: Yes. Toleration medication: Yes. Family/Significant other contact made: yes, SPE completed with pt's sister.  Patient understands diagnosis: Yes. Discussing patient identified problems/goals with staff: Yes. Medical problems stabilized or resolved: Yes. Denies suicidal/homicidal ideation: Yes. Issues/concerns per patient self-inventory: No. Other: n/a   New problem(s) identified: No, Describe:  n/a  New Short Term/Long Term Goal(s): detox, medication management for mood stabilization; elimination of SI thoughts; development of comprehensive mental wellness/sobriety plan.   Patient Goals:  "I want help with my mood and to get better."   Discharge Plan or Barriers: CSW assessing for appropriate referrals. Return home; [facility]. MHAG pamphlet, Mobile Crisis information, and AA/NA information provided to patient for additional community support and resources.   Reason for Continuation of Hospitalization: Anxiety Depression Medication stabilization Suicidal ideation Withdrawal symptoms  Estimated Length of Stay: Wed, 10/12/17  Attendees: Patient: Renee Marshall  10/10/2017 8:50 AM  Physician: Dr. Jola Babinskilary MD; Dr. Altamese Carolinaainville MD 10/10/2017 8:50 AM  Nursing: Huntley DecSara RN; Marcelino DusterMichelle RN 10/10/2017 8:50 AM  RN Care Manager:x 10/10/2017 8:50 AM  Social Worker: Corrie MckusickHeather Carrie Schoonmaker LCSW 10/10/2017 8:50 AM  Recreational Therapist: x 10/10/2017 8:50 AM  Other: Armandina StammerAgnes Nwoko NP; Hillery Jacksanika Lewis NP 10/10/2017 8:50 AM  Other:  10/10/2017 8:50 AM  Other: 10/10/2017 8:50 AM    Scribe for Treatment Team: Rona RavensHeather S Ajani Schnieders, LCSW 10/10/2017 8:50 AM

## 2017-10-11 DIAGNOSIS — F17210 Nicotine dependence, cigarettes, uncomplicated: Secondary | ICD-10-CM

## 2017-10-11 DIAGNOSIS — F1920 Other psychoactive substance dependence, uncomplicated: Secondary | ICD-10-CM

## 2017-10-11 DIAGNOSIS — R450 Nervousness: Secondary | ICD-10-CM

## 2017-10-11 DIAGNOSIS — Z79899 Other long term (current) drug therapy: Secondary | ICD-10-CM

## 2017-10-11 MED ORDER — DULOXETINE HCL 20 MG PO CPEP
40.0000 mg | ORAL_CAPSULE | Freq: Every day | ORAL | Status: DC
  Administered 2017-10-12 – 2017-10-13 (×2): 40 mg via ORAL
  Filled 2017-10-11: qty 2
  Filled 2017-10-11 (×2): qty 14
  Filled 2017-10-11: qty 2

## 2017-10-11 NOTE — Progress Notes (Signed)
Recreation Therapy Notes  Animal-Assisted Activity (AAA) Program Checklist/Progress Notes Patient Eligibility Criteria Checklist & Daily Group note for Rec Tx Intervention  Date: 6.25.19 Time: 1430 Location: [address] Dayroom   AAA/T Program Assumption of Risk Form signed by Patient/ or Parent Legal Guardian  YES   Patient is free of allergies or sever asthma  YES  Patient reports no fear of animals YES   Patient reports no history of cruelty to animals YES   Patient understands his/her participation is voluntary YES   Patient washes hands before animal contact YES   Patient washes hands after animal contact YES   Education: Hand Washing, Appropriate Animal Interaction   Education Outcome: Acknowledges understanding/In group clarification offered/Needs additional education.   Clinical Observations/Feedback: Pt did not attend group.    Tracie Dore, LRT/CTRS         Renee Marshall 10/11/2017 4:03 PM 
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
## 2017-10-11 NOTE — Plan of Care (Signed)
  Problem: Activity: Goal: Sleeping patterns will improve Outcome: Progressing   Problem: Education: Goal: Emotional status will improve Outcome: Not Progressing Goal: Mental status will improve Outcome: Not Progressing   

## 2017-10-11 NOTE — Progress Notes (Signed)
D: Patient was complaining of chest discomfort earlier and EKG was ordered.  Reviewed with MD; normal EKG.  Patient requested anxiety medication, however it was too early.  She was given librium and clonidine this morning.  She rates her anxiety as a 10 today.  She is sleeping and eating well.  Her concentration is good; energy level is low per self inventory.  She reports withdrawal symptoms as cravings, agitation, chilling and irritability.  She rates her depression and hopelessness as an 8. Her goal today is "to work on going home."  She denies any thoughts of self harm.  A: Continue to monitor medication management and MD orders.  Safety checks completed every 15 minutes per protocol.  Offer support and encouragement as needed.  R: Patient is anxious and restless.

## 2017-10-11 NOTE — [facility] Group Notes (Signed)
[facility] Group Therapy 10/11/2017 1:15pm  Type of Therapy: Mental [employer] Presentation  Participation Level: Active  Participation Quality: Attentive  Affect: Appropriate  Cognitive: Oriented  Insight: Developing/Improving  Engagement in Therapy: Engaged  Modes of Intervention: Discussion, Education and Socialization  Summary of Progress/Problems: Mental [employer] Association (MHA) Speaker came to talk about his personal journey with mental health. The pt processed ways by which to relate to the speaker. MHA speaker provided handouts and educational information pertaining to groups and services offered by the MHA. Pt was engaged in speaker's presentation and was receptive to resources provided.    Renee Marshall S Renee Herst, LCSW 10/11/2017 1:52 PM  

## 2017-10-11 NOTE — Progress Notes (Signed)
Pt did not attend goals and orientation group this morning  

## 2017-10-12 ENCOUNTER — Encounter (HOSPITAL_COMMUNITY): Payer: Self-pay | Admitting: Behavioral Health

## 2017-10-12 DIAGNOSIS — F1490 Cocaine use, unspecified, uncomplicated: Secondary | ICD-10-CM

## 2017-10-12 MED ORDER — GABAPENTIN 300 MG PO CAPS: 300.0000 mg | ORAL_CAPSULE | Freq: Three times a day (TID) | ORAL | 0 refills | Status: AC

## 2017-10-12 MED ORDER — GABAPENTIN 300 MG PO CAPS
300.0000 mg | ORAL_CAPSULE | Freq: Three times a day (TID) | ORAL | Status: DC
Start: 2017-10-12 — End: 2017-10-13
  Administered 2017-10-12 – 2017-10-13 (×4): 300 mg via ORAL
  Filled 2017-10-12 (×5): qty 21
  Filled 2017-10-12: qty 1
  Filled 2017-10-12: qty 21
  Filled 2017-10-12 (×3): qty 1

## 2017-10-12 MED ORDER — HYDROXYZINE HCL 50 MG PO TABS: 50.0000 mg | ORAL_TABLET | Freq: Four times a day (QID) | ORAL | 0 refills | Status: AC | PRN

## 2017-10-12 MED ORDER — DULOXETINE HCL 40 MG PO CPEP: 40.0000 mg | ORAL_CAPSULE | Freq: Every day | ORAL | 0 refills | Status: AC

## 2017-10-12 MED ORDER — THIAMINE HCL 100 MG PO TABS: 100.0000 mg | ORAL_TABLET | Freq: Every day | ORAL | 0 refills | Status: AC

## 2017-10-12 MED ORDER — GABAPENTIN 300 MG PO CAPS: 300.0000 mg | ORAL_CAPSULE | Freq: Two times a day (BID) | ORAL | 0 refills | Status: DC

## 2017-10-12 MED ORDER — TRAZODONE HCL 100 MG PO TABS: 100.0000 mg | ORAL_TABLET | Freq: Every day | ORAL | 0 refills | Status: AC

## 2017-10-12 NOTE — Progress Notes (Signed)
Patient will probably be discharged tomorrow not today.  Vistaril given patient for anxiety.  Patient stated she is going back to school and make positive outcome for her life. \
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
## 2017-10-12 NOTE — [facility] Group Notes (Signed)
[facility] Group Notes:  (Nursing/MHT/Case Management/Adjunct)  Date:  10/12/2017  Time:  4:00 pm  Type of Therapy:  Psychoeducational Skills  Participation Level:  Active  Participation Quality:  Appropriate  Affect:  Appropriate  Cognitive:  Appropriate  Insight:  Appropriate  Engagement in Group:  Engaged  Modes of Intervention:  Discussion and Education  Summary of Progress/Problems: Patient was alert and participated in group.    Earline MayotteKnight, Delia Slatten Shephard 10/12/2017, 5:51 PM

## 2017-10-12 NOTE — Progress Notes (Signed)
Recreation Therapy Notes  Date: 6.26.19 Time: 0930 Location: 300 [address] Dayroom  Group Topic: Stress Management  Goal Area(s) Addresses:  Patient will verbalize importance of using healthy stress management.  Patient will identify positive emotions associated with healthy stress management.   Intervention: Stress Management  Activity :  Body Scan Meditation.  LRT introduced the stress management technique of meditation.  LRT played meditation to allow patients to take note of any sensations or tensions they may be feeling.  Patients were to follow along as meditation played.  Education:  Stress Management, Discharge Planning.   Education Outcome: Acknowledges edcuation/In group clarification offered/Needs additional education  Clinical Observations/Feedback: Pt did not attend group.     Caroll RancherMarjette Madex Seals, LRT/CTRS         Caroll RancherLindsay, Lolly Glaus A 10/12/2017 12:06 PM

## 2017-10-12 NOTE — Progress Notes (Signed)
D:  Patient's self inventory sheet, patient has fair sleep, sleep medication helpful.  Poor appetite, low energy level, poor concentration.  Withdrawals, diarrhea, chilling, agitation, irritability.  Denied physical problems.  Physical pain, worst pain in past 24 hours is #8, all over.  Pain medication helpful.  Goal is discharge.  Plans to take step by step.  Does have discharge plans. A:  Medications administered per MD orders.  Emotional support and encouragement given patient. R:  Denied SI and HI, contracts for safety.  Denied A/V hallucinations.  Safety maintained with 15 minute checks.

## 2017-10-12 NOTE — Progress Notes (Addendum)
  [facility] :  Will you be returning to the same living situation after discharge:  No. Pt plans to live with sister at discharge.  At discharge, do you have transportation home?: Yes,  sister/family member.  Patient discharge rescheduled for Thursday, 6/27 Do you have the ability to pay for your medications: Yes,  mental health  Release of information consent forms completed and submitted to medical records by CSW.  Patient to Follow up at: Follow-up Information    [facility] Follow up on 10/18/2017.   Why:  Hospital follow-up on Tuesday, 10/18/17 at 8:00AM. Please bring: photo ID, social security card, hospital discharge paperwork, and any proof of household income if you have it. Thank you.  Contact information: [address] [place] [address] [phone]           Next level of care provider [facility] to [facility] Link:no  Safety Planning and Suicide Prevention discussed: Yes,  SPE completed with pt's sister and with pt; SPI pamphlet and Mobile Crisis information provided to pt.   Have you used any form of tobacco in the last 30 days? (Cigarettes, Smokeless Tobacco, Cigars, and/or Pipes): Yes  Has patient been referred to the Quitline?: Patient refused referral  Patient has been referred for addiction treatment: Yes  Rona RavensHeather S Lilyanah Celestin, LCSW 10/12/2017, 9:21 AM

## 2017-10-12 NOTE — Progress Notes (Signed)
Pt came to the NS early in the shift requesting prn meds, stating that she was feeling very anxious and "jumpy" inside.  She also c/o generalized pain which she rated as 10/10.  Pt was given Vistaril 50 mg along with Robaxin 500 mg and Naproxen 500 mg per protocol orders.  Later pt said the meds were helpful.  She denies SI/HI/AVH at this time.  She makes her needs known to staff.  She chose not to attend the AA group even though she was invited.  Pt was encouraged to participate in the unit activities for her treatment.  Support and encouragement offered.  Pt makes her needs known to staff.  Discharge plans are in process.  Safety maintained with q15 minute checks.

## 2017-10-13 ENCOUNTER — Encounter (HOSPITAL_COMMUNITY): Payer: Self-pay | Admitting: Behavioral Health

## 2017-10-13 NOTE — Progress Notes (Signed)
Patient ID: Laverda SorensonLacy Rose Cortez, female   DOB: 11/13/1980, 37 y.o.   MRN: [id number]  Nursing Progress Note [id number]  Data: Patient presents calm, pleasant and cooperative in the milieu. Patient complaint with scheduled medications. Patient denies pain/physical complaints. Patient completed self-inventory sheet and rates depression, hopelessness, and anxiety 6,0,10 respectively. Patient rates their sleep and appetite as good/fair respectively. Patient states goal for today is to "go home". Patient is seen attending groups and visible in the milieu. Patient currently denies SI/HI/AVH.   Action: Patient educated about and provided medication per provider's orders. Patient safety maintained with q15 min safety checks and frequent rounding. Low fall risk precautions in place. Emotional support given. 1:1 interaction and active listening provided. Patient encouraged to attend meals and groups. Patient encouraged to work on treatment plan and goals. Labs, vital signs and patient behavior monitored throughout shift.   Response: Patient agrees to come to staff if any thoughts of SI/HI develop or if patient develops intention of acting on thoughts. Patient remains safe on the unit at this time. Patient is interacting with peers appropriately on the unit. Will continue to support and monitor.

## 2017-10-13 NOTE — Progress Notes (Signed)
Patient ID: Renee SorensonLacy Rose Taketa, female   DOB: 01/24/1981, 37 y.o.   MRN: [id number]  Discharge Note  D) Patient discharged to lobby. Patient states readiness for discharge. Patient denies SI/HI, AVH and is not delusional or psychotic. Patient in no acute distress. Patient has completed their Suicide Safety Plan. Patient provided an opportunity to complete and return Patient Satisfaction Survey.   A) Written and verbal discharge instructions given to the patient. Patient accepting to information and verbalized understanding. Patient agrees to the discharge plan. Opportunity for questions and concerns presented to patient. Patient denied any further questions or concerns. All belongings returned to patient. Patient signed for return of belongings and discharge paperwork. Patient provided a copy of their Suicide Safety Plan and has been provided a copy of the Patient Satisfaction Survey with return instructions.  R) Patient safely escorted to the lobby. [facility] samples, prescriptions, personal belongings, follow-up appointment in place and discharge paperwork.

## 2017-10-13 NOTE — [facility] Suicide Risk Assessment (Signed)
[facility] Discharge Suicide Risk Assessment   Principal Problem: Bipolar II disorder [facility]) Discharge Diagnoses:  Patient Active Problem List   Diagnosis Date Noted  . Bipolar II disorder (HCC) [F31.81] 10/07/2017  . Polysubstance (including opioids) dependence with physiological dependence [facility]) [F19.20] 10/07/2017    Total Time spent with patient: 30 minutes  Musculoskeletal: Strength & Muscle Tone: within normal limits Gait & Station: normal Patient leans: N/A  Psychiatric Specialty Exam: ROS denies headache, no chest pain, no shortness of breath, no vomiting  Blood pressure 114/76, pulse 96, temperature 98.2 F (36.8 C), temperature source Oral, resp. rate 16, height 5\' 5"  (1.651 m), weight 103.9 kg (229 lb), SpO2 100 %.Body mass index is 38.11 kg/m.  General Appearance: Improving grooming  Eye Contact::  Good  Speech:  Normal Rate -less slowed  Volume:  Normal  Mood:  Reports she is feeling better, denies feeling depressed at this time  Affect:  Becoming more reactive, smiles at times appropriately during session  Thought Process:  Linear and Descriptions of Associations: Intact  Orientation:  Full (Time, Place, and Person)-patient is fully oriented x3  Thought Content:  No hallucinations, no delusions, not internally preoccupied  Suicidal Thoughts:  No-currently denies any suicidal or self-injurious ideations, no homicidal or violent ideations  Homicidal Thoughts:  No  Memory: Recall is 3 out of 3 immediate and 3 out of 3 at 3 minutes  Judgement:  Other:  Improving  Insight:  Improving  Psychomotor Activity:  Normal  Concentration:  Good  Recall:  Good  Fund of Knowledge:Good  Language: Good  Akathisia:  Negative  Handed:  Right  AIMS (if indicated):     Assets:  Communication Skills Desire for Improvement Resilience  Sleep:  Number of Hours: 6.75  Cognition: WNL  ADL's:  Intact   Mental Status Per Nursing Assessment::   On Admission:  Self-harm  thoughts  Demographic Factors:  37 year old female  Loss Factors: Relationship stressors  Historical Factors: Reports history of depression, prior diagnosis of bipolar disorder, history of substance abuse, history of self cutting  Risk Reduction Factors:   Living with another person, especially a relative, Positive social support and Positive coping skills or problem solving skills  Patient reports her sister is very supportive, is also a CSW, and states sister will be helping her to obtain community resources  Continued Clinical Symptoms:  At present she reports improvement, states she is feeling "a lot better" than on admission, affect is becoming more reactive and smiles at times appropriately during session/presents less blunted, no thought disorder, denies any suicidal ideations, no homicidal or violent ideations, specifically also denies any violent or homicidal ideations towards her boyfriend, no hallucinations, no delusions are expressed. No disruptive behaviors on unit, cooperative on approach. Currently denies medication side effects.  We reviewed side effects and importance of not driving or operating machinery if sedated.   Cognitive Features That Contribute To Risk:  No gross cognitive deficits noted upon discharge. Is alert , attentive, and oriented x 3    Suicide Risk:  Mild:  Suicidal ideation of limited frequency, intensity, duration, and specificity.  There are no identifiable plans, no associated intent, mild dysphoria and related symptoms, good self-control (both objective and subjective assessment), few other risk factors, and identifiable protective factors, including available and accessible social support.  Follow-up Information    [facility] Follow up on 10/18/2017.   Why:  Hospital follow-up on Tuesday, 10/18/17 at 8:00AM. Please bring: photo ID, social security card, hospital discharge paperwork,  and any proof of household income if you have it. Thank you.  Contact  information: [address] [place] [address] [phone]           Plan Of Care/Follow-up recommendations:  Activity:  As tolerated Diet:  regular Tests:  NA Other:  See below  Patient expressing readiness for discharge, at this time there are no ongoing grounds for involuntary commitment, she is leaving unit in good spirits. States she is planning to go live with her sister who provides a safe, sober, supportive setting. Follow-up as above.  Craige CottaFernando A Dossie Ocanas, MD 10/13/2017, 11:19 AM

## 2017-10-13 NOTE — Progress Notes (Signed)
  [facility] :  Will you be returning to the same living situation after discharge:  No. Pt plans to live with sister at discharge.  At discharge, do you have transportation home?: Yes,  sister/family member.   Do you have the ability to pay for your medications: Yes,  mental health  Release of information consent forms completed and submitted to medical records by CSW.  Patient to Follow up at: Follow-up Information    [facility] Follow up on 10/18/2017.   Why:  Hospital follow-up on Tuesday, 10/18/17 at 8:00AM. Please bring: photo ID, social security card, hospital discharge paperwork, and any proof of household income if you have it. Thank you.  Contact information: [address] [place] [address] [phone]           Next level of care provider [facility] to [facility] Link:no  Safety Planning and Suicide Prevention discussed: Yes,  SPE completed with pt's sister and with pt; SPI pamphlet and Mobile Crisis information provided to pt.   Have you used any form of tobacco in the last 30 days? (Cigarettes, Smokeless Tobacco, Cigars, and/or Pipes): Yes  Has patient been referred to the Quitline?: Patient refused referral  Patient has been referred for addiction treatment: Yes  Rona RavensHeather S Mauriah Mcmillen, LCSW 10/13/2017, 10:06 AM

## 2017-10-13 NOTE — Progress Notes (Signed)
Pt reports she has had a better day today than yesterday.  She denies SI/HI/AVH.  She says her withdrawal symptoms have decreased.  She voiced no needs or concerns and says that she is probably going to discharge tomorrow.  She says her sister is a Child psychotherapistsocial worker and is going to help her find the resources she needs.  Pt has been pleasant and appropriate.  She did not attend evening NA group, but chose to stay in bed.  Support and encouragement offered.  Discharge plans are in process.  Safety maintained with q15 minute checks.

## 2017-10-13 NOTE — Progress Notes (Signed)
Adult Psychoeducational Group Note  Date:  10/13/2017 Time:  9:39 AM  Group Topic/Focus:  Orientation:   The focus of this group is to educate the patient on the purpose and policies of crisis stabilization and provide a format to answer questions about their admission.  The group details unit policies and expectations of patients while admitted.  Participation Level:  Active  Participation Quality:  Appropriate  Affect:  Appropriate  Cognitive:  Alert  Insight: Appropriate  Engagement in Group:  Engaged  Modes of Intervention:  Discussion and Education  Additional Comments:    Pt participated in group. Pt's goal today is to work on going home.   Karren CobbleFizah G Chelesea Weiand 10/13/2017, 9:39 AM

## 2019-02-19 ENCOUNTER — Other Ambulatory Visit: Payer: Self-pay

## 2019-02-19 DIAGNOSIS — Z20822 Contact with and (suspected) exposure to covid-19: Secondary | ICD-10-CM

## 2019-02-20 LAB — NOVEL CORONAVIRUS, NAA: SARS-CoV-2, NAA: NOT DETECTED
